# Patient Record
Sex: Male | Born: 1995
Health system: Southern US, Community
[De-identification: ages and names within clinical notes are randomized; demographics above are authoritative.]

## PROBLEM LIST (undated history)

## (undated) DIAGNOSIS — F419 Anxiety disorder, unspecified: Secondary | ICD-10-CM

## (undated) HISTORY — PX: WISDOM TOOTH EXTRACTION: SHX21

## (undated) HISTORY — DX: Anxiety disorder, unspecified: F41.9

---
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
## 2015-06-15 ENCOUNTER — Ambulatory Visit (INDEPENDENT_AMBULATORY_CARE_PROVIDER_SITE_OTHER): Payer: Federal, State, Local not specified - PPO | Admitting: Cardiovascular Disease

## 2015-06-15 ENCOUNTER — Encounter: Payer: Self-pay | Admitting: Cardiovascular Disease

## 2015-06-15 VITALS — BP 108/80 | HR 69 | Ht 67.0 in | Wt 161.8 lb

## 2015-06-15 DIAGNOSIS — G8929 Other chronic pain: Secondary | ICD-10-CM | POA: Diagnosis not present

## 2015-06-15 DIAGNOSIS — R079 Chest pain, unspecified: Secondary | ICD-10-CM

## 2015-06-15 NOTE — Assessment & Plan Note (Signed)
Etiology of his chest pain is unclear Atypical in nature. As detailed, no exacerbation with exertion, He does not have any risk factors for coronary artery disease. Tried NSAIDs with no relief, perhaps less likely pericarditis. Low risk for DVT and PE. Denies shortness of breath or leg swelling that would be associated with this. No recent immobility that would lead to DVT. Normal EKG indicating no structural heart disease, and exam is essentially normal We have discussed that if he is concerned about exercising, we could have him complete a routine treadmill study.  If symptoms get worse, could consider echocardiogram or CT scan of the chest. Long discussion with the patient and his mother concerning various testing that is available if needed. We did not change his medications. We did suggest if symptoms recur that he again try NSAIDs. Initially it was felt his symptoms were secondary to anxiety or stress but symptoms have persisted despite school being over the summer.    Total encounter time more than 60 minutes  Greater than 50% was spent in counseling and coordination of care with the patient

## 2015-06-15 NOTE — Patient Instructions (Signed)
You are doing well. No medication changes were made.  Please call if chest pain gets worse, Options include treadmill stress test, echocardiogram If severe, we could order a CT scan   Please call us if you have new issues that need to be addressed before your next appt.

## 2015-06-15 NOTE — Progress Notes (Signed)
Patient ID: Troy Butler, male    DOB: 06-May-1995, 20 y.o.   MRN: [id number]  HPI Comments: Mr. 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 Elmer Ramp for symptoms of chest pain.  Symptoms started November 2016, 1 week before finals It was relatively intense to start, chest pain on the left. No reproducible symptoms on palpation, did not hurt with deep inspiration. Seemed to stutter when it started, then went all night, went to urgent care in [facility] who referred him to the emergency room.  In the emergency room. A chest x-ray, other workup and was told that he had atypical chest pain and was discharged home. Since then he has had continuous chest pain sometimes radiating to bilateral chest, sometimes down his right arm. No change with position, no shortness of breath, no change in intensity on exertion.  Mother will sometimes see him holding his left chest.  He did start low-dose Zoloft 25 mg with increase up to 50 mg. At that time symptoms did get moderately better.  He describes the pain as 4/10, constant. Pain did not seem to improve going from 50 mg Zoloft up to 100 mg  Even on today's visit, reports his chest pain has been ongoing. Small fluctuations in intensity but he has learned to live with it. Does not feel it is better in the morning or the evening when the day, just constant. He is more nervous about activity. Likes to play basketball, soccer but has not been doing so. He does walk long distances to get to class and has not noticed any exaggeration in his symptoms.  Reports he tried NSAIDs with no relief, tried GERD medication with no relief. Does not seem to be exacerbated or relieved by food.  EKG on today's visit shows normal sinus rhythm with rate 69 bpm, no significant ST or T-wave changes       No Known Allergies    Medication List       This list is accurate as of: 06/15/15 11:15 AM.  Always  use your most recent med list.               sertraline 100 MG tablet  Commonly known as:  ZOLOFT  Take by mouth.        Past Medical History  Diagnosis Date  . Anxiety     History reviewed. No pertinent past surgical history.  Social History  reports that he has never smoked. He does not have any smokeless tobacco history on file. He reports that he does not drink alcohol or use illicit drugs.  Family History No family history of coronary artery disease  Review of Systems  Constitutional: Negative.   Respiratory: Positive for chest tightness.   Cardiovascular: Positive for chest pain.  Gastrointestinal: Negative.   Musculoskeletal: Negative.   Neurological: Negative.   Hematological: Negative.   Psychiatric/Behavioral: Negative.   All other systems reviewed and are negative.   BP 108/80 mmHg  Pulse 69  Ht  (1.702 m)  Wt 161 lb 12 oz (73.369 kg)  BMI 25.33 kg/m2  Physical Exam  Constitutional: He is oriented to person, place, and time. He appears well-developed and well-nourished.  HENT:  Head: Normocephalic.  Nose: Nose normal.  Mouth/Throat: Oropharynx is clear and moist.  Eyes: Conjunctivae are normal. Pupils are equal, round, and reactive to light.  Neck: Normal range of motion. Neck supple. No JVD present.  Cardiovascular: Normal  rate, regular rhythm, normal heart sounds and intact distal pulses.  Exam reveals no gallop and no friction rub.   No murmur heard. Pulmonary/Chest: Effort normal and breath sounds normal. No respiratory distress. He has no wheezes. He has no rales. He exhibits no tenderness.  Abdominal: Soft. Bowel sounds are normal. He exhibits no distension. There is no tenderness.  Musculoskeletal: Normal range of motion. He exhibits no edema or tenderness.  Lymphadenopathy:    He has no cervical adenopathy.  Neurological: He is alert and oriented to person, place, and time. Coordination normal.  Skin: Skin is warm and dry. No rash  noted. No erythema.  Psychiatric: He has a normal mood and affect. His behavior is normal. Judgment and thought content normal.

## 2015-06-15 NOTE — Assessment & Plan Note (Signed)
[email or web address]

## 2015-10-16 ENCOUNTER — Other Ambulatory Visit: Payer: Self-pay | Admitting: Family Medicine

## 2015-10-16 DIAGNOSIS — R079 Chest pain, unspecified: Secondary | ICD-10-CM

## 2015-10-16 DIAGNOSIS — R0789 Other chest pain: Principal | ICD-10-CM

## 2015-10-20 ENCOUNTER — Ambulatory Visit
Admission: RE | Admit: 2015-10-20 | Discharge: 2015-10-20 | Disposition: A | Discharge status: Home / Self Care | Payer: Federal, State, Local not specified - PPO | Source: Ambulatory Visit | Attending: Family Medicine | Admitting: Family Medicine

## 2015-10-20 DIAGNOSIS — R0789 Other chest pain: Secondary | ICD-10-CM | POA: Diagnosis not present

## 2015-10-20 DIAGNOSIS — R079 Chest pain, unspecified: Secondary | ICD-10-CM

## 2015-10-20 MED ORDER — IOPAMIDOL (ISOVUE-300) INJECTION 61%
75.0000 mL | Freq: Once | INTRAVENOUS | Status: AC | PRN
  Administered 2015-10-20: 75 mL via INTRAVENOUS

## 2015-10-22 ENCOUNTER — Ambulatory Visit: Payer: Federal, State, Local not specified - PPO

## 2016-08-14 ENCOUNTER — Ambulatory Visit
Admission: EM | Admit: 2016-08-14 | Discharge: 2016-08-14 | Disposition: A | Discharge status: Home / Self Care | Payer: Federal, State, Local not specified - PPO | Attending: Family Medicine | Admitting: Family Medicine

## 2016-08-14 DIAGNOSIS — H6503 Acute serous otitis media, bilateral: Secondary | ICD-10-CM

## 2016-08-14 DIAGNOSIS — H6983 Other specified disorders of Eustachian tube, bilateral: Secondary | ICD-10-CM | POA: Diagnosis not present

## 2016-08-14 MED ORDER — AMOXICILLIN-POT CLAVULANATE 875-125 MG PO TABS: 1.0000 | ORAL_TABLET | Freq: Two times a day (BID) | ORAL | 0 refills | Status: DC

## 2016-08-14 MED ORDER — FLUTICASONE PROPIONATE 50 MCG/ACT NA SUSP: 2.0000 | Freq: Every day | NASAL | 0 refills | Status: DC

## 2016-08-14 MED ORDER — LORATADINE-PSEUDOEPHEDRINE ER 10-240 MG PO TB24: 1.0000 | ORAL_TABLET | Freq: Every day | ORAL | 0 refills | Status: DC

## 2016-08-14 NOTE — ED Provider Notes (Signed)
[facility]    CSN: [id number] Arrival date & time: 08/14/16  1031     History   Chief Complaint Chief Complaint  Patient presents with  . Sinusitis    HPI Troy Butler is a 21 y.o. male.   Patient states that he was in the [place] last week she started having a sore throat on Wednesday the throat did get better started getting congested on Friday Saturday he had a miserable flight back to [place] with sinus pressure ear pains and overall headaches when he flew he still is having nasal congestion and pressure in his ears and hematemesis place as well. Otherwise teeth no other surgeries no known drug allergies he does not smoke. No smokes around him is no pertinent medical problems no pertinent family medical history relevant to today's visit.   The history is provided by the patient. No language interpreter was used.  Sinusitis  Pain details:    Location:  Frontal and maxillary   Quality:  Aching   Severity:  Moderate   Duration:  3 days   Timing:  Constant Progression:  Worsening Chronicity:  New Relieved by:  Nothing Worsened by:  Nothing Ineffective treatments:  None tried Associated symptoms: ear pain and sore throat   Ear pain:    Location:  Bilateral   Severity:  Moderate   Onset quality:  Sudden   Duration:  3 days Sore throat:    Severity:  Mild   Progression:  Improving   Past Medical History:  Diagnosis Date  . Anxiety     Patient Active Problem List   Diagnosis Date Noted  . Chest pain at rest 06/15/2015  . Chronic pain 06/15/2015    Past Surgical History:  Procedure Laterality Date  . NO PAST SURGERIES    . WISDOM TOOTH EXTRACTION         Home Medications    Prior to Admission medications   Medication Sig Start Date End Date Taking? Authorizing Provider  amoxicillin-clavulanate (AUGMENTIN) 875-125 MG tablet Take 1 tablet by mouth 2 (two) times daily. 08/14/16   Hassan Rowan, MD  fluticasone (FLONASE) 50 MCG/ACT  nasal spray Place 2 sprays into both nostrils daily. 08/14/16   Hassan Rowan, MD  loratadine-pseudoephedrine (CLARITIN-D 24 HOUR) 10-240 MG 24 hr tablet Take 1 tablet by mouth daily. 08/14/16   Hassan Rowan, MD  sertraline (ZOLOFT) 100 MG tablet Take by mouth. 04/13/15   [provider]    Family History Family History  Problem Relation Age of Onset  . Family history unknown: Yes    Social History Social History  Substance Use Topics  . Smoking status: Never Smoker  . Smokeless tobacco: Never Used  . Alcohol use No     Allergies   Patient has no known allergies.   Review of Systems Review of Systems  HENT: Positive for ear pain, facial swelling, sinus pain, sinus pressure and sore throat.   All other systems reviewed and are negative.    Physical Exam Triage Vital Signs ED Triage Vitals  Enc Vitals Group     BP 08/14/16 1116 (!) 106/57     Pulse Rate 08/14/16 1116 72     Resp 08/14/16 1116 18     Temp 08/14/16 1116 98 F (36.7 C)     Temp Source 08/14/16 1116 Oral     SpO2 08/14/16 1116 99 %     Weight 08/14/16 1114 165 lb (74.8 kg)     Height 08/14/16  1114 5\' 7"  (1.702 m)     Head Circumference --      Peak Flow --      Pain Score 08/14/16 1114 4     Pain Loc --      Pain Edu? --      Excl. in GC? --    No data found.   Updated Vital Signs BP (!) 106/57 (BP Location: Left Arm)   Pulse 72   Temp 98 F (36.7 C) (Oral)   Resp 18   Ht 5\' 7"  (1.702 m)   Wt 165 lb (74.8 kg)   SpO2 99%   BMI 25.84 kg/m   Visual Acuity Right Eye Distance:   Left Eye Distance:   Bilateral Distance:    Right Eye Near:   Left Eye Near:    Bilateral Near:     Physical Exam  Constitutional: He is oriented to person, place, and time. He appears well-developed and well-nourished.  HENT:  Head: Normocephalic and atraumatic.  Right Ear: Hearing normal. Tympanic membrane is injected and erythematous.  Left Ear: Hearing and ear canal normal. Tympanic membrane is  injected and erythematous.  Nose: Mucosal edema present. Right sinus exhibits maxillary sinus tenderness and frontal sinus tenderness. Left sinus exhibits maxillary sinus tenderness and frontal sinus tenderness.  Mouth/Throat: Uvula is midline and mucous membranes are normal. No uvula swelling. Posterior oropharyngeal erythema present.  Eyes: Conjunctivae and EOM are normal. Pupils are equal, round, and reactive to light.  Neck: Normal range of motion.  Pulmonary/Chest: Effort normal.  Musculoskeletal: Normal range of motion.  Lymphadenopathy:    He has cervical adenopathy.  Neurological: He is alert and oriented to person, place, and time.  Skin: Skin is warm and dry.  Psychiatric: He has a normal mood and affect.  Vitals reviewed.    UC Treatments / Results  Labs (all labs ordered are listed, but only abnormal results are displayed) Labs Reviewed - No data to display  EKG  EKG Interpretation None       Radiology No results found.  Procedures Procedures (including critical care time)  Medications Ordered in UC Medications - No data to display   Initial Impression / Assessment and Plan / UC Course  I have reviewed the triage vital signs and the nursing notes.  Pertinent labs & imaging results that were available during my care of the patient were reviewed by me and considered in my medical decision making (see chart for details).    He does report inability to do a Valsalva maneuver to open his ears and once again he was able do that today we will place him on Augmentin 875 one tablet twice a day or today 1 tablet daily and Flonase days spray 2 puffs each nostril followed his PCP next week if needed work note given for him for today as well   Final Clinical Impressions(s) / UC Diagnoses   Final diagnoses:  Bilateral acute serous otitis media, recurrence not specified  Dysfunction of both eustachian tubes    New Prescriptions Discharge Medication List as of  08/14/2016 12:11 PM    START taking these medications   Details  amoxicillin-clavulanate (AUGMENTIN) 875-125 MG tablet Take 1 tablet by mouth 2 (two) times daily., Starting Sun 08/14/2016, Normal    fluticasone (FLONASE) 50 MCG/ACT nasal spray Place 2 sprays into both nostrils daily., Starting Sun 08/14/2016, Normal    loratadine-pseudoephedrine (CLARITIN-D 24 HOUR) 10-240 MG 24 hr tablet Take 1 tablet by mouth daily., Starting Sun  08/14/2016, Normal         Hassan RowanWade, Cylinda Santoli, MD 08/14/16 [phone]

## 2016-08-14 NOTE — ED Triage Notes (Signed)
Patient complains of sinus pain and pressure, pressure in ears. Patient states that he flew back yesterday from [place] yesterday. Patient states that pain yesterday in his ears. Patient states that symptoms originally started 1 week ago.

## 2016-11-16 ENCOUNTER — Other Ambulatory Visit: Payer: Self-pay | Admitting: Family Medicine

## 2016-11-16 ENCOUNTER — Ambulatory Visit (INDEPENDENT_AMBULATORY_CARE_PROVIDER_SITE_OTHER): Payer: Federal, State, Local not specified - PPO | Admitting: Family Medicine

## 2016-11-16 ENCOUNTER — Encounter: Payer: Self-pay | Admitting: Family Medicine

## 2016-11-16 VITALS — BP 128/68 | HR 74 | Temp 98.3°F | Resp 16 | Ht 67.0 in | Wt 167.0 lb

## 2016-11-16 DIAGNOSIS — G8929 Other chronic pain: Secondary | ICD-10-CM

## 2016-11-16 DIAGNOSIS — Z0000 Encounter for general adult medical examination without abnormal findings: Secondary | ICD-10-CM

## 2016-11-16 DIAGNOSIS — Z7689 Persons encountering health services in other specified circumstances: Secondary | ICD-10-CM

## 2016-11-16 DIAGNOSIS — R0789 Other chest pain: Secondary | ICD-10-CM

## 2016-11-16 DIAGNOSIS — Z13220 Encounter for screening for lipoid disorders: Secondary | ICD-10-CM

## 2016-11-16 NOTE — Progress Notes (Signed)
Subjective:    Patient ID: Troy Butler, male    DOB: 03-05-1995, 21 y.o.   MRN: [id number]  Troy Butler is a 21 y.o. male presenting on 11/16/2016 for Establish Care (pt is concerned about chest pain onset 2 years)  Previously established with Dr Elmer Ramp ([facility] in [place]), patient here now to re-establish since previous PCP left that practice.  HPI   CHEST PAIN, Atypical Reports one concern with chest pain over past 2 years, seems to be more constant and present most days, nothing seems to make worse or improves, describes various areas of chest that he can localize to 1-2 fingers, seems to "move around" can have different pains, either tightness or pinching but not a pressure or aching pain. Currently he feels sensation at this time and it is active mild pain, has less of a sensation if he is not actively thinking about it or if distracted then may not feel any symptom at all. - He reviews prior evaluation from PCP and even including referral to [facility] Dr Mariah Milling, he has had various meds including Tylenol, NSAID courses prolonged without relief, trial on Sertraline 50-100mg  for 3 months for presumed anxiety/panic symptoms (which he does not think were the case, and has not had any anxiety problems by report), also tried PPI for GERD for several weeks to months without relief. Cardiology evaluated with EKG, CXR, and eventually Chest CT unremarkable. - Currently not on any medicine for it, tolerating it well overall and states that it does not limit his daily function or activity. 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 - He is actively working at [employer], does  some physical work lifting boxes shipping/receiving, active without any issue or trigger of the chest symptoms  Health Maintenance: - Due for Flu Shot, declines today despite counseling on benefits  Depression screen PHQ 2/9 11/16/2016  Decreased Interest 0  Down, Depressed, Hopeless 0  PHQ - 2 Score 0   GAD 7 : Generalized Anxiety Score 11/16/2016  Nervous, Anxious, on Edge 0  Control/stop worrying 0  Worry too much - different things 0  Trouble relaxing 0  Restless 0  Easily annoyed or irritable 0  Afraid - awful might happen 0  Total GAD 7 Score 0  Anxiety Difficulty Not difficult at all    History reviewed. No pertinent past medical history. Past Surgical History:  Procedure Laterality Date  . WISDOM TOOTH EXTRACTION     Social History   Social History  . Marital status: Single    Spouse name: N/A  . Number of children: N/A  . Years of education: College   Occupational History  . College Student     [employer] (Graduate within 1 year)  . [employer] (Supply)    Social History Main Topics  . Smoking status: Never Smoker  . Smokeless tobacco: Never Used  . Alcohol use 0.6 oz/week    1 Cans of beer per week     Comment: Rare alcohol consumption  . Drug use: No  . Sexual activity: Not on file   Other Topics Concern  . Not on file   Social History Narrative  .  No narrative on file   Family History  Problem Relation Age of Onset  . Prostate cancer Neg Hx   . Colon cancer Neg Hx   . Heart disease Neg Hx    No current outpatient prescriptions on file prior to visit.   No current facility-administered medications on file prior to visit.     Review of Systems  Constitutional: Negative for activity change, appetite change, chills, diaphoresis, fatigue, fever and unexpected weight change.  HENT: Negative for congestion, hearing loss and sinus pressure.   Eyes: Negative for visual disturbance.  Respiratory: Negative for apnea,  cough, choking, chest tightness, shortness of breath and wheezing.   Cardiovascular: Positive for chest pain (chest wall pain vs discomfort, chronic see note). Negative for palpitations and leg swelling.  Gastrointestinal: Negative for abdominal pain, anal bleeding, blood in stool, constipation, diarrhea, nausea and vomiting.  Endocrine: Negative for cold intolerance and polyuria.  Genitourinary: Negative for decreased urine volume, difficulty urinating, dysuria, frequency, hematuria and testicular pain.  Musculoskeletal: Negative for arthralgias, back pain and neck pain.  Skin: Negative for rash.  Allergic/Immunologic: Negative for environmental allergies.  Neurological: Negative for dizziness, weakness, light-headedness, numbness and headaches.  Hematological: Negative for adenopathy.  Psychiatric/Behavioral: Negative for behavioral problems, dysphoric mood, self-injury, sleep disturbance and suicidal ideas. The patient is not nervous/anxious.    Per HPI unless specifically indicated above     Objective:    BP 128/68   Pulse 74   Temp 98.3 F (36.8 C) (Oral)   Resp 16   Ht  (1.702 m)   Wt 167 lb (75.8 kg)   BMI 26.16 kg/m   Wt Readings from Last 3 Encounters:  11/16/16 167 lb (75.8 kg)  08/14/16 165 lb (74.8 kg)  06/15/15 161 lb 12 oz (73.4 kg) (61 %, Z= 0.27)*   * Growth percentiles are based on CDC 2-20 Years data.    Physical Exam  Constitutional: He is oriented to person, place, and time. He appears well-developed and well-nourished. No distress.  Well-appearing, comfortable, cooperative, very pleasant, athletic build  HENT:  Head: Normocephalic and atraumatic.  Mouth/Throat: Oropharynx is clear and moist.  Frontal / maxillary sinuses non-tender. Nares patent without purulence or edema. Bilateral TMs clear without erythema, effusion or bulging. Oropharynx clear without erythema, exudates, edema or asymmetry.  Eyes: Pupils are equal, round, and reactive to light.  Conjunctivae and EOM are normal. Right eye exhibits no discharge. Left eye exhibits no discharge.  Neck: Normal range of motion. Neck supple. No thyromegaly present.  Cardiovascular: Normal rate, regular rhythm, normal heart sounds and intact distal pulses.   No murmur heard. Pulmonary/Chest: Effort normal and breath sounds normal. No respiratory distress. He has no wheezes. He has no rales. He exhibits no tenderness (Non reproducible, but patient able to localize pain to 1-2 fingers in right upper to mid chest more midline not lateral).  Abdominal: Soft. Bowel sounds are normal. He exhibits no distension and no mass. There is no tenderness.  Musculoskeletal: Normal range of motion. He exhibits no edema or tenderness.  Upper / Lower Extremities: - Normal muscle tone, strength bilateral upper extremities 5/5, lower extremities 5/5  Lymphadenopathy:    He has no cervical adenopathy.  Neurological: He is alert and oriented to person, place, and time.  Distal sensation intact to light touch all extremities  Skin: Skin is warm and dry. No rash noted. He is not diaphoretic. No erythema.  Psychiatric: He has a normal mood and affect. His behavior  is normal.  Well groomed, good eye contact, normal speech and thoughts. Good insight into health.  Nursing note and vitals reviewed.  No results found for this or any previous visit.    Assessment & Plan:   Problem List Items Addressed This Visit    Chest wall pain, chronic - Primary    Stable chronic problem >2 yr with active constant chest wall discomfort localized now to R mid to upper chest wall, minimal to mild symptom. No associated symptoms or factors. Not reproducible. Not exertional. - Prior negative work-up includes Cardiac (Cardiology, EKG, CXR) and Chest CT, also trial on med management for GERD, Anxiety, MSK with NSAIDs/Tylenol without relief  Plan: 1. Discussion today on potential differential diagnosis, seems to be limited options at this  point given extensive previous work-up. Symptoms is bothering him but not limiting his function in anyway, overall given reassurance today. Reviewed possibility of more superficial cause such as nerve or fascial symptoms and maybe referred pain, additional consideration not fully reviewed would be myofascial or even rib related with possible slipping rib syndrome (but does not endorse popping or clicking). - Recommend consider alternative approach now - such as chiropractor, accupuncture - Future may consider OMT adjustment if interested, would rib raising or other myofascial techniques 2. No new meds - but may try OTC topical therapy such as muscle rub, tiger balm, asper-creme since he is able to localize symptoms 3. Future labs ordered for 6 months then Annual Physical and if no significant change or concern can do yearly visits for physical       Other Visit Diagnoses    Encounter to establish care with new doctor          No orders of the defined types were placed in this encounter.   Follow up plan: Return in about 6 months (around 05/17/2017) for Annual Physical.  Saralyn Pilar, DO [facility] 11/16/2016, 10:10 PM

## 2016-11-16 NOTE — Assessment & Plan Note (Addendum)
Stable chronic problem >2 yr with active constant chest wall discomfort localized now to R mid to upper chest wall, minimal to mild symptom. No associated symptoms or factors. Not reproducible. Not exertional. - Prior negative work-up includes Cardiac (Cardiology, EKG, CXR) and Chest CT, also trial on med management for GERD, Anxiety, MSK with NSAIDs/Tylenol without relief  Plan: 1. Discussion today on potential differential diagnosis, seems to be limited options at this point given extensive previous work-up. Symptoms is bothering him but not limiting his function in anyway, overall given reassurance today. Reviewed possibility of more superficial cause such as nerve or fascial symptoms and maybe referred pain, additional consideration not fully reviewed would be myofascial or even rib related with possible slipping rib syndrome (but does not endorse popping or clicking). - Recommend consider alternative approach now - such as chiropractor, accupuncture - Future may consider OMT adjustment if interested, would rib raising or other myofascial techniques 2. No new meds - but may try OTC topical therapy such as muscle rub, tiger balm, asper-creme since he is able to localize symptoms 3. Future labs ordered for 6 months then Annual Physical and if no significant change or concern can do yearly visits for physical

## 2016-11-16 NOTE — Patient Instructions (Addendum)
Thank you for coming to the clinic today.  1.  I don't know the exact cause of your chest wall discomfort. It sounds like you have completed a very thorough work-up in the past with previous doctors including Cardiologist  I am very reassured by every bit of history that I hear today and I think that this is more of a benign nuisance rather than a serious medical problem, however it is hard to say.  One option to consider would be more skin / nerve / fascia symptoms - this could be related to these soft tissues instead of the muscle, bone or joints. And we have ruled out heart as a cause.  CHIROPRACTOR  [facility] [address] [place] [place], [place] [address] Phone: [phone]  [facility] [address] [place], [place] [address]  Dr Annamaria Boots [facility] (sends detailed noted)  ------------------------------------------  Patrici Ranks Chiropractor in [place]  Consider acupuncture as an alternative trial in future   DUE for FASTING BLOOD WORK (no food or drink after midnight before the lab appointment, only water or coffee without cream/sugar on the morning of)  SCHEDULE "Lab Only" visit in the morning at the clinic for lab draw in 6 MONTHS   - Make sure Lab Only appointment is at about 1 week before your next appointment, so that results will be available  For Lab Results, once available within 2-3 days of blood draw, you can can log in to MyChart online to view your results and a brief explanation. Also, we can discuss results at next follow-up visit.  Please schedule a Follow-up Appointment to: Return in about 6 months (around 05/17/2017) for Annual Physical.  If you have any other questions or concerns, please feel free to call the clinic or send a message through MyChart. You may also schedule an earlier appointment if necessary.  Additionally, you may be receiving a survey about your experience at our clinic within a few days to 1 week by e-mail or mail. We  value your feedback.  Saralyn Pilar, DO [facility], [place]

## 2016-11-21 ENCOUNTER — Encounter: Payer: Self-pay | Admitting: Family Medicine

## 2017-05-11 ENCOUNTER — Other Ambulatory Visit: Payer: Federal, State, Local not specified - PPO

## 2017-05-18 ENCOUNTER — Encounter: Payer: Self-pay | Admitting: Family Medicine

## 2017-05-18 ENCOUNTER — Ambulatory Visit (INDEPENDENT_AMBULATORY_CARE_PROVIDER_SITE_OTHER): Payer: Federal, State, Local not specified - PPO | Admitting: Family Medicine

## 2017-05-18 ENCOUNTER — Other Ambulatory Visit: Payer: Self-pay

## 2017-05-18 VITALS — BP 116/68 | HR 78 | Temp 98.2°F | Resp 16 | Ht 67.0 in | Wt 158.0 lb

## 2017-05-18 DIAGNOSIS — Z0000 Encounter for general adult medical examination without abnormal findings: Secondary | ICD-10-CM

## 2017-05-18 DIAGNOSIS — G8929 Other chronic pain: Secondary | ICD-10-CM | POA: Diagnosis not present

## 2017-05-18 DIAGNOSIS — R0789 Other chest pain: Secondary | ICD-10-CM | POA: Diagnosis not present

## 2017-05-18 DIAGNOSIS — Z13220 Encounter for screening for lipoid disorders: Secondary | ICD-10-CM

## 2017-05-18 NOTE — Progress Notes (Signed)
Subjective:    Patient ID: Troy CortiDylan K Centrella, male    DOB: [birth year], 22 y.o.   MRN: [id number]  Troy Butler is a 22 y.o. male presenting on 05/18/2017 for Annual Exam   HPI   Lifestyle: - He is doing well overall. Currently still attending school at [employer] for degree in Supply Chain Management will graduate in Dec 2019. 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 Does not do heavy lifting - He is currently not active and not trying to lose weight. - He goes to gym on occasion, but recently has not been as active with this, busy with school and work. He used to do weight training and cardio treadmill exercise - He has tried to improve healthy diet, better choices and now has some appropriate weight loss, down 7-10 lbs. He is at healthy BMI 24 - See social history below, again reviewed - he rarely consumes alcohol, never smoker - He is sexually active, uses condoms, denies any symptoms of STD and declines STD screening  FOLLOW-UP Atypical Chest Pain - Last visit with me 11/16/16, for initial visit for same problem, chronic >2 years, treated with reassurance and offered possibility for trial OMT, Chiropractor among other options, reviewed possible diagnoses such as Precordial Catch Syndrome or Slipping Rib Syndrome, seems to be benign MSK etiology, see prior notes for background information. - Today patient reports his symptoms have not changed much since last visit. He still experiences the discomfort, it is not worse but does not seem to be much better. It does not bother him or interfere with his daily function - Denies any other associated symptoms or new concerns   Health Maintenance:  He has declined routine HIV screening test  UTD TDap vaccine  [facility] 05/18/2017 11/16/2016  Decreased Interest 0 0  Down, Depressed, Hopeless 0 0  PHQ - 2 Score 0 0    History reviewed. No pertinent past medical history. Past Surgical History:  Procedure Laterality Date  . WISDOM  TOOTH EXTRACTION     Social History   Socioeconomic History  . Marital status: Single    Spouse name: Not on file  . Number of children: Not on file  . Years of education: College  . Highest education level: Not on file  Occupational History  . Occupation: [employer]    Comment: [employer] (Graduate within 1 year)  . 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 Financial resource strain: Not on file  . Food insecurity:    Worry: Not on file    Inability: Not on file  . Transportation needs:    Medical: Not on file    Non-medical: Not on file  Tobacco Use  . Smoking status: Never Smoker  . Smokeless tobacco: Never Used  Substance and Sexual Activity  . Alcohol use: Yes    Alcohol/week: 0.6 oz    Types: 1 Cans of beer per week    Comment: Rare alcohol consumption  . Drug use: No  . Sexual activity: Not on file  Lifestyle  . Physical activity:    Days per week: Not on file    Minutes per session: Not on file  . Stress: Not on file  Relationships  . Social connections:    Talks on phone: Not on file    Gets together: Not on file    Attends religious service: Not on file    Active member of club or organization:  Not on file    Attends meetings of clubs or organizations: Not on file    Relationship status: Not on file  . Intimate partner violence:    Fear of current or ex partner: Not on file    Emotionally abused: Not on file    Physically abused: Not on file    Forced sexual activity: Not on file  Other Topics Concern  . Not on file  Social History Narrative  . Not on file   Family History  Problem Relation Age of Onset  . Prostate cancer Neg Hx   . Colon cancer Neg Hx   . Heart disease Neg Hx    No current outpatient medications on file prior to visit.   No current facility-administered medications on file prior to visit.     Review of Systems  Constitutional: Negative for activity change, appetite change, chills,  diaphoresis, fatigue and fever.  HENT: Negative for congestion, hearing loss and sinus pressure.   Eyes: Negative for visual disturbance.  Respiratory: Negative for apnea, cough, choking, chest tightness, shortness of breath and wheezing.   Cardiovascular: Negative for chest pain, palpitations and leg swelling.  Gastrointestinal: Negative for abdominal pain, anal bleeding, blood in stool, constipation, diarrhea, nausea and vomiting.  Endocrine: Negative for cold intolerance and polyuria.  Genitourinary: Negative for decreased urine volume, difficulty urinating, dysuria, frequency, hematuria, scrotal swelling, testicular pain and urgency.  Musculoskeletal: Negative for arthralgias, back pain, gait problem and neck pain.  Skin: Negative for rash.  Allergic/Immunologic: Negative for environmental allergies.  Neurological: Negative for dizziness, weakness, light-headedness, numbness and headaches.  Hematological: Negative for adenopathy.  Psychiatric/Behavioral: Negative for behavioral problems, dysphoric mood and sleep disturbance. The patient is not nervous/anxious.    Per HPI unless specifically indicated above      Objective:    BP 116/68 (BP Location: Left Arm, Patient Position: Sitting, Cuff Size: Normal)   Pulse 78   Temp 98.2 F (36.8 C)   Resp 16   Ht 5\' 7"  (1.702 m)   Wt 158 lb (71.7 kg)   SpO2 100%   BMI 24.75 kg/m   Wt Readings from Last 3 Encounters:  05/18/17 158 lb (71.7 kg)  11/16/16 167 lb (75.8 kg)  08/14/16 165 lb (74.8 kg)    Physical Exam  Constitutional: He is oriented to person, place, and time. He appears well-developed and well-nourished. No distress.  Well-appearing, comfortable, cooperative  HENT:  Head: Normocephalic and atraumatic.  Mouth/Throat: Oropharynx is clear and moist.  Frontal / maxillary sinuses non-tender. Nares patent without purulence or edema. Bilateral TMs clear without erythema, effusion or bulging. Oropharynx clear without erythema,  exudates, edema or asymmetry.  Eyes: Pupils are equal, round, and reactive to light. Conjunctivae and EOM are normal. Right eye exhibits no discharge. Left eye exhibits no discharge.  Neck: Normal range of motion. Neck supple. No thyromegaly present.  Cardiovascular: Normal rate, regular rhythm, normal heart sounds and intact distal pulses.  No murmur heard. Pulmonary/Chest: Effort normal and breath sounds normal. No respiratory distress. He has no wheezes. He has no rales. He exhibits no tenderness (Non reproducible).  Abdominal: Soft. Bowel sounds are normal. He exhibits no distension and no mass. There is no tenderness.  Genitourinary:  Genitourinary Comments: Declined external genital / hernia exam.  Musculoskeletal: Normal range of motion. He exhibits no edema or tenderness.  Upper / Lower Extremities: - Normal muscle tone, strength bilateral upper extremities 5/5, lower extremities 5/5  Lymphadenopathy:    He has  no cervical adenopathy.  Neurological: He is alert and oriented to person, place, and time.  Distal sensation intact to light touch all extremities  Skin: Skin is warm and dry. No rash noted. He is not diaphoretic. No erythema.  Psychiatric: He has a normal mood and affect. His behavior is normal.  Well groomed, good eye contact, normal speech and thoughts  Nursing note and vitals reviewed.  No results found for this or any previous visit.    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 Not reproducible. Not exertional. - Prior negative work-up includes Cardiac (Cardiology, EKG, CXR) and Chest CT, also trial on med management for GERD, Anxiety, MSK with NSAIDs/Tylenol without relief  Plan: 1. Reassurance again - no  new concerns, reviewed possible benign MSK dx - Remain off meds - Recommend to keep monitoring for change - Encourage to consider trial with chiropractor in future if needed - Pending lab results - Follow-up as needed if any change or worsening       Other Visit Diagnoses    Annual physical exam    -  Primary Updated health maintenance and history in chart Declines routine HIV screen Obtained fasting labs today for baseline, will review and forward results Encourage maintain healthy lifestyle, resume regular exercise as needed    Screening cholesterol level          No orders of the defined types were placed in this encounter.   Follow up plan: Return in about 1 year (around 05/19/2018) for Annual Physical.  Saralyn Pilar, DO [facility] 05/18/2017, 9:27 AM

## 2017-05-18 NOTE — Patient Instructions (Addendum)
Thank you for coming to the office today.  Keep up the good work.  Good luck with graduation and future job.  If chest pain symptoms change let me know or come back sooner.  Will release blood test results to your MyChart as soon as available.  Please schedule a Follow-up Appointment to: Return in about 1 year (around 05/19/2018) for Annual Physical.  If you have any other questions or concerns, please feel free to call the office or send a message through MyChart. You may also schedule an earlier appointment if necessary.  Additionally, you may be receiving a survey about your experience at our office within a few days to 1 week by e-mail or mail. We value your feedback.  Saralyn PilarAlexander Ciarah Peace, DO [facility], [place]

## 2017-05-18 NOTE — Assessment & Plan Note (Signed)
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 Not reproducible. Not exertional. - Prior negative work-up includes Cardiac (Cardiology, EKG, CXR) and Chest CT, also trial on med management for GERD, Anxiety, MSK with NSAIDs/Tylenol without relief  Plan: 1. Reassurance again - no new concerns, reviewed possible benign MSK dx - Remain off meds - Recommend to keep monitoring for change - Encourage to consider trial with chiropractor in future if needed - Pending lab results - Follow-up as needed if any change or worsening

## 2017-05-19 LAB — CBC WITH DIFFERENTIAL/PLATELET
BASOS ABS: 73 {cells}/uL (ref 0–200)
Basophils Relative: 1.4 %
EOS ABS: 151 {cells}/uL (ref 15–500)
Eosinophils Relative: 2.9 %
HCT: 48 % (ref 38.5–50.0)
Hemoglobin: 16.4 g/dL (ref 13.2–17.1)
Lymphs Abs: 1882 cells/uL (ref 850–3900)
MCH: 29.5 pg (ref 27.0–33.0)
MCHC: 34.2 g/dL (ref 32.0–36.0)
MCV: 86.3 fL (ref 80.0–100.0)
MONOS PCT: 10.6 %
MPV: 10.6 fL (ref 7.5–12.5)
Neutro Abs: 2543 cells/uL (ref 1500–7800)
Neutrophils Relative %: 48.9 %
PLATELETS: 232 10*3/uL (ref 140–400)
RBC: 5.56 10*6/uL (ref 4.20–5.80)
RDW: 12.8 % (ref 11.0–15.0)
TOTAL LYMPHOCYTE: 36.2 %
WBC mixed population: 551 cells/uL (ref 200–950)
WBC: 5.2 10*3/uL (ref 3.8–10.8)

## 2017-05-19 LAB — COMPLETE METABOLIC PANEL WITH GFR
AG RATIO: 2 (calc) (ref 1.0–2.5)
ALKALINE PHOSPHATASE (APISO): 66 U/L (ref 40–115)
ALT: 17 U/L (ref 9–46)
AST: 19 U/L (ref 10–40)
Albumin: 4.7 g/dL (ref 3.6–5.1)
BILIRUBIN TOTAL: 0.8 mg/dL (ref 0.2–1.2)
BUN: 16 mg/dL (ref 7–25)
CHLORIDE: 104 mmol/L (ref 98–110)
CO2: 30 mmol/L (ref 20–32)
Calcium: 9.6 mg/dL (ref 8.6–10.3)
Creat: 1.06 mg/dL (ref 0.60–1.35)
GFR, EST AFRICAN AMERICAN: 116 mL/min/{1.73_m2} (ref >=60)
GFR, Est Non African American: 100 mL/min/{1.73_m2} (ref >=60)
Globulin: 2.3 g/dL (calc) (ref 1.9–3.7)
Glucose, Bld: 90 mg/dL (ref 65–99)
POTASSIUM: 4.5 mmol/L (ref 3.5–5.3)
Sodium: 140 mmol/L (ref 135–146)
Total Protein: 7 g/dL (ref 6.1–8.1)

## 2017-05-19 LAB — LIPID PANEL
CHOL/HDL RATIO: 3.6 (calc) (ref <5.0)
Cholesterol: 160 mg/dL (ref <200)
HDL: 45 mg/dL (ref >40)
LDL Cholesterol (Calc): 102 mg/dL (calc) — ABNORMAL HIGH
Non-HDL Cholesterol (Calc): 115 mg/dL (calc) (ref <130)
TRIGLYCERIDES: 43 mg/dL (ref <150)

## 2017-07-19 IMAGING — CT CT CHEST W/ CM
2 of 3 series · 15 of 36 positions shown, 18 images · IV contrast (iopamidol)
Comparison: None.

CLINICAL DATA: Chest pain over the last year. No history of trauma
or shortness of breath. Unknown etiology.

EXAM:
CT CHEST WITH CONTRAST
TECHNIQUE: Multidetector CT imaging of the chest was performed during
intravenous contrast administration.
CONTRAST:  75mL ELHYPR-TAA IOPAMIDOL (ELHYPR-TAA) INJECTION 61%

[Series 2: axial st · axial · 0.66mm/px · z∈[-650,-396]mm · 12 of 149 slices shown, 15 images]
[im 11/149  mediastinal]
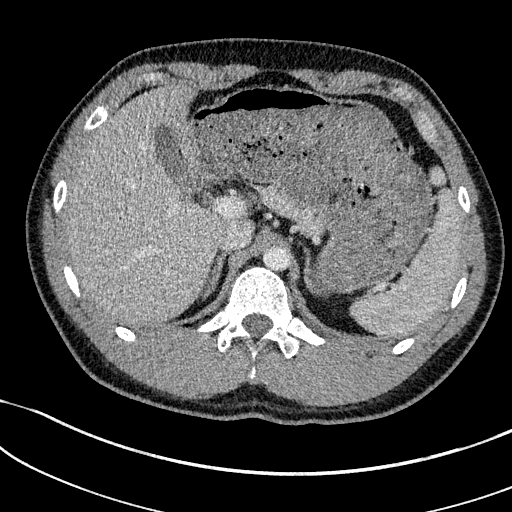
[im 11/149  lung]
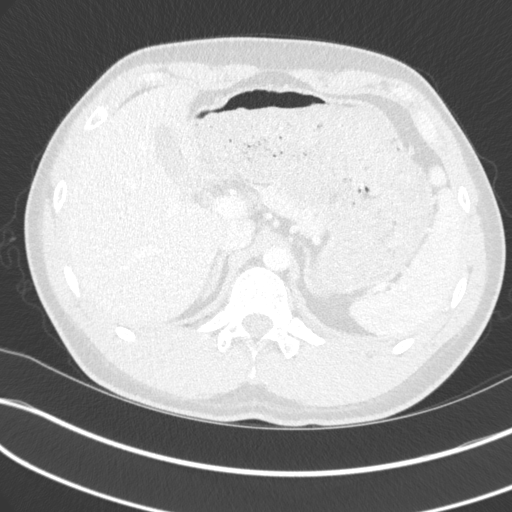
[im 22/149  lung]
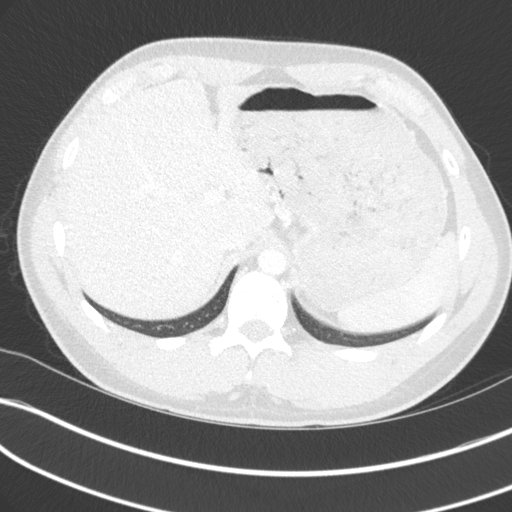
[im 33/149  lung]
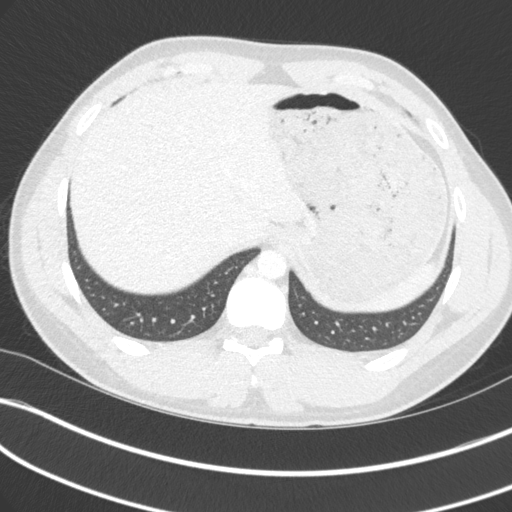
[im 44/149  lung]
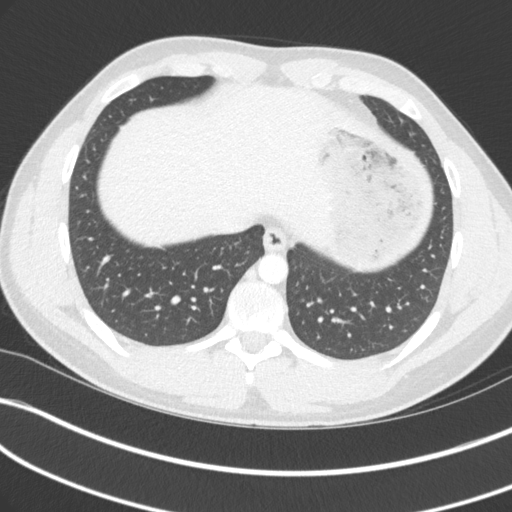
[im 55/149  mediastinal]
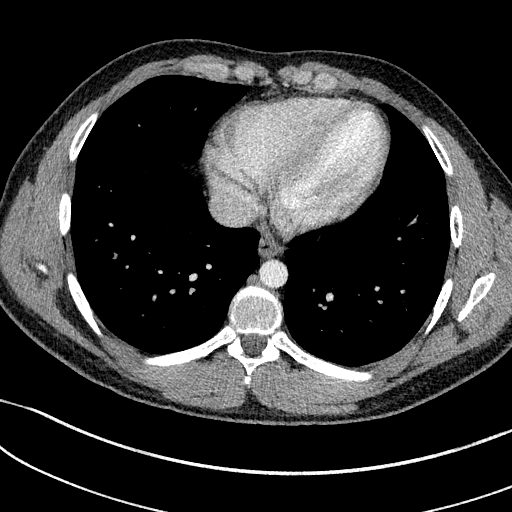
[im 55/149  lung]
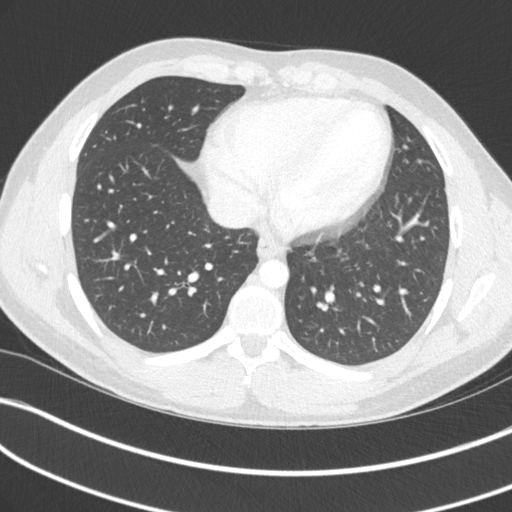
[im 66/149  lung]
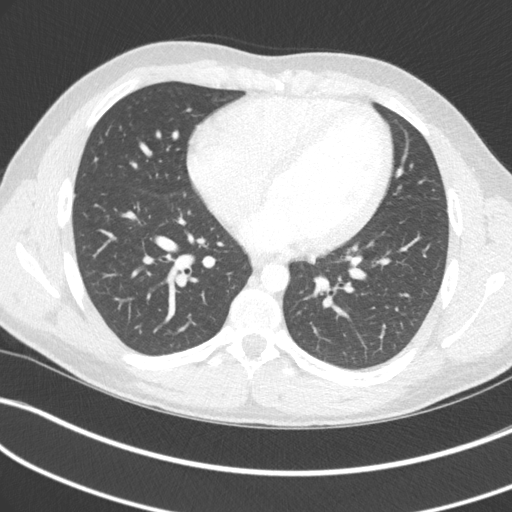
[im 83/149  lung]
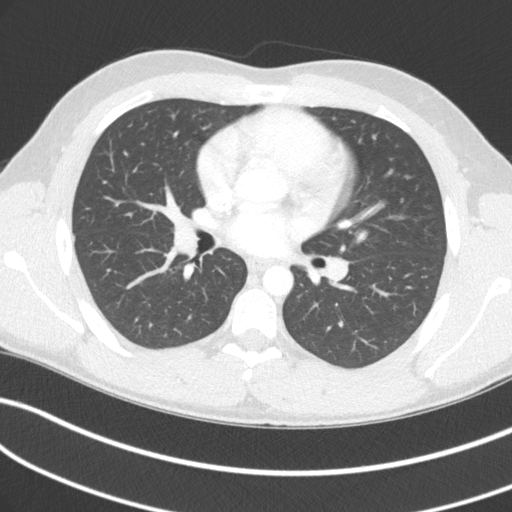
[im 94/149  lung]
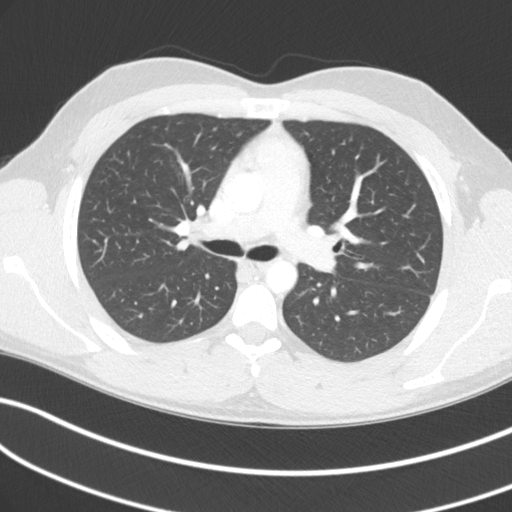
[im 105/149  mediastinal]
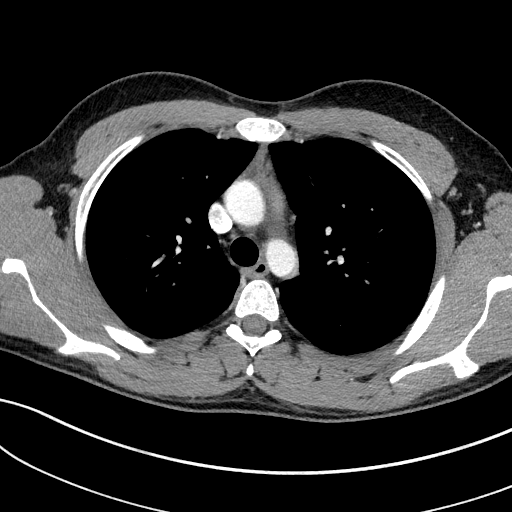
[im 105/149  lung]
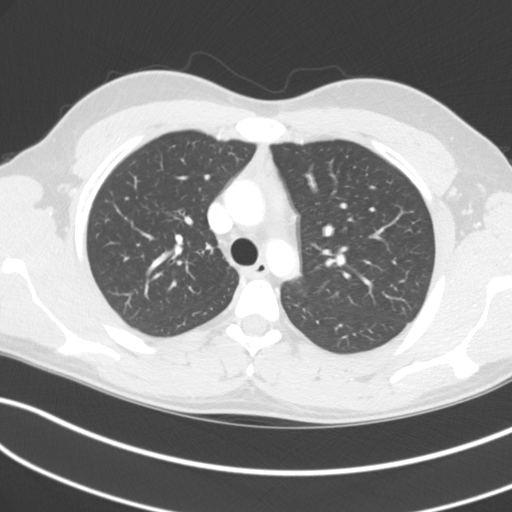
[im 116/149  lung]
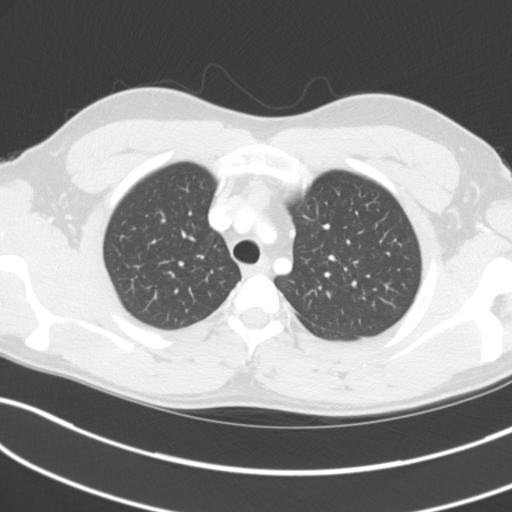
[im 127/149  lung]
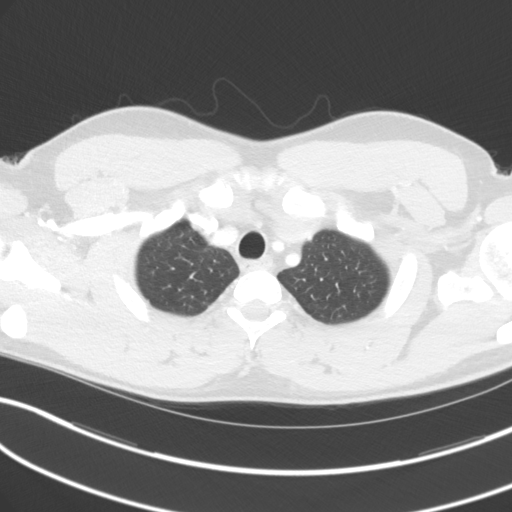
[im 138/149  lung]
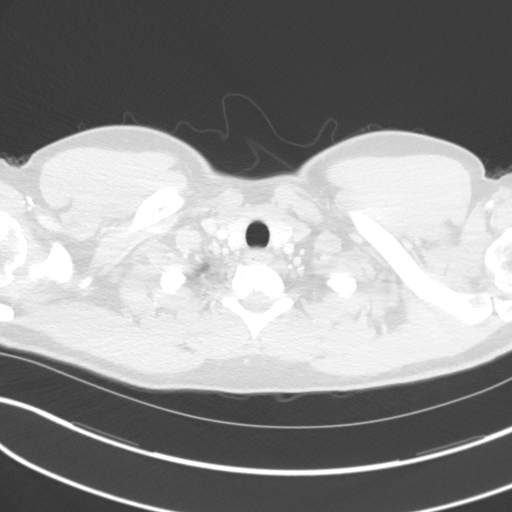

[Series 5: coronal · coronal · 0.60mm/px · 3 of 115 slices shown]
[im 23/115  lung]
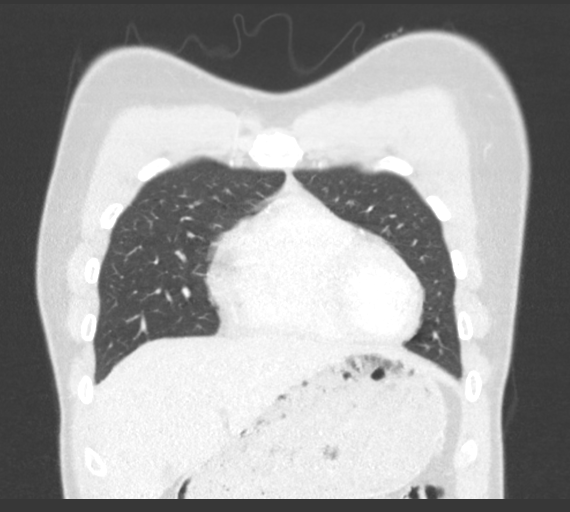
[im 46/115  lung]
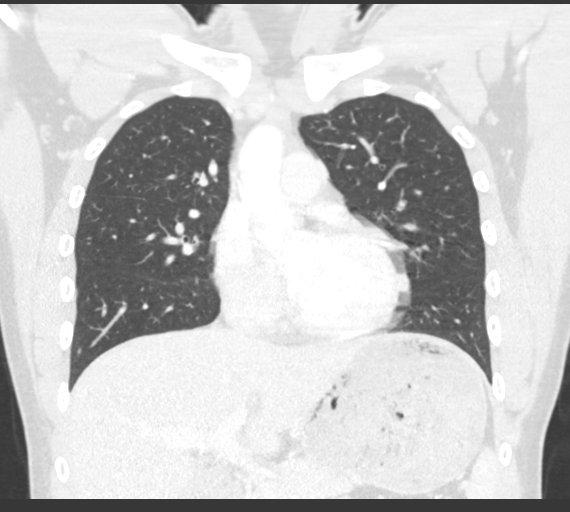
[im 69/115  lung]
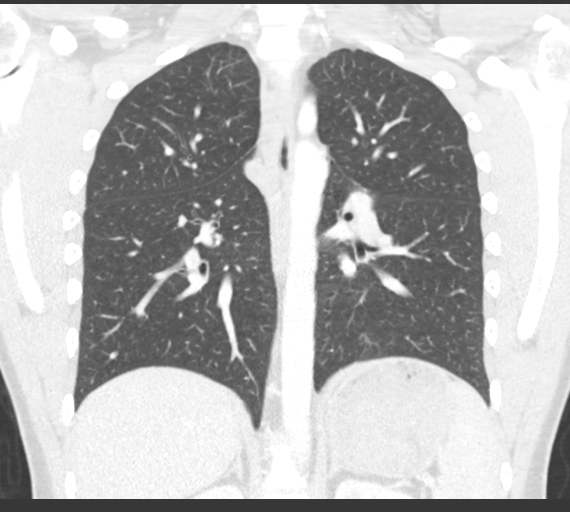

[15 of 36 positions shown; findings below may reference images not displayed]

FINDINGS: Cardiovascular: Heart shows normal size and normal relative chamber
is. No visible coronary artery calcification. The aorta is normally
formed and is not show any atherosclerosis or other lesion.
Pulmonary arteries appear normal. No pericardial fluid.

Mediastinum/Nodes: No mass or lymphadenopathy. Normal thymus for
age.

Lungs/Pleura: No pleural fluid. Lung parenchyma is clear and normal.

Upper Abdomen: Normal

Musculoskeletal: Normal. Minimal endplate Schmorl's nodes in the
thoracic region, frequently seen in asymptomatic individuals.

No axillary pathology.  No chest wall muscular pathology.
IMPRESSION: Normal examination.  No etiology of pain identified.

## 2017-08-22 ENCOUNTER — Encounter: Payer: Self-pay | Admitting: Family Medicine

## 2019-05-17 ENCOUNTER — Ambulatory Visit: Payer: Federal, State, Local not specified - PPO | Attending: Internal Medicine

## 2019-05-17 DIAGNOSIS — Z23 Encounter for immunization: Secondary | ICD-10-CM

## 2019-05-17 NOTE — Progress Notes (Signed)
   Covid-19 Vaccination Clinic  Name:  Troy Butler    MRN: [id number] DOB: 13-Aug-1995  05/17/2019  [name] was observed post Covid-19 immunization for 15 minutes without incident. He was provided with Vaccine Information Sheet and instruction to access the V-Safe system.   [name] was instructed to call 911 with any severe reactions post vaccine: Marland Kitchen Difficulty breathing  . Swelling of face and throat  . A fast heartbeat  . A bad rash all over body  . Dizziness and weakness   Immunizations Administered    Name Date Dose VIS Date Route   Pfizer COVID-19 Vaccine 05/17/2019  4:10 PM 0.3 mL 01/18/2019 Intramuscular   Manufacturer: [employer], [employer]   Lot: [id number]   NDC: [address]-9

## 2019-06-12 ENCOUNTER — Ambulatory Visit: Payer: Federal, State, Local not specified - PPO | Attending: Internal Medicine

## 2019-06-12 DIAGNOSIS — Z23 Encounter for immunization: Secondary | ICD-10-CM

## 2019-06-12 NOTE — Progress Notes (Signed)
   Covid-19 Vaccination Clinic  Name:  Troy Butler    MRN: [id number] DOB: 11-12-1995  06/12/2019  Mr. Troy Butler was observed post Covid-19 immunization for 15 minutes without incident. He was provided with Vaccine Information Sheet and instruction to access the V-Safe system.   [name] was instructed to call 911 with any severe reactions post vaccine: Marland Kitchen Difficulty breathing  . Swelling of face and throat  . A fast heartbeat  . A bad rash all over body  . Dizziness and weakness   Immunizations Administered    Name Date Dose VIS Date Route   Pfizer COVID-19 Vaccine 06/12/2019  9:45 AM 0.3 mL 04/03/2018 Intramuscular   Manufacturer: [employer], [employer]   Lot: [id number]   NDC: [address]-1

## 2019-09-17 ENCOUNTER — Other Ambulatory Visit: Payer: Self-pay

## 2019-09-17 ENCOUNTER — Ambulatory Visit (INDEPENDENT_AMBULATORY_CARE_PROVIDER_SITE_OTHER): Payer: Federal, State, Local not specified - PPO | Admitting: Family Medicine

## 2019-09-17 ENCOUNTER — Encounter: Payer: Self-pay | Admitting: Family Medicine

## 2019-09-17 VITALS — BP 109/52 | HR 81 | Temp 97.8°F | Resp 16 | Ht 68.0 in | Wt 156.0 lb

## 2019-09-17 DIAGNOSIS — H60332 Swimmer's ear, left ear: Secondary | ICD-10-CM | POA: Diagnosis not present

## 2019-09-17 DIAGNOSIS — H9202 Otalgia, left ear: Secondary | ICD-10-CM | POA: Diagnosis not present

## 2019-09-17 MED ORDER — CIPROFLOXACIN-DEXAMETHASONE 0.3-0.1 % OT SUSP
4.0000 [drp] | Freq: Two times a day (BID) | OTIC | 0 refills | Status: DC
Start: 2019-09-17 — End: 2019-11-11

## 2019-09-17 NOTE — Progress Notes (Signed)
Subjective:    Patient ID: Troy Butler, male    DOB: [birth year], 24 y.o.   MRN: [id number]  Troy Butler is a 24 y.o. male presenting on 09/17/2019 for Ear Pain (left side onset yesterday)  Patient presents for a same day appointment.  HPI   Left Ear Pain Prior history with ear infections and other ear problems, has had prior complicated AOM in past while flying on airplane, had severe popping and pain. He has not established with ENT. - He had been doing well recently until acute issue since yesterday. He went white water rafting for first time, said he did get water in ears, now has some pain in Left ear. He denies any head trauma or injury that would provoke symptoms acutely. Denies any discharge or drainage from ears Denies any fevers chills sweats nausea vomiting headache, hearing loss  Depression screen [facility] 2/9 05/18/2017 11/16/2016  Decreased Interest 0 0  Down, Depressed, Hopeless 0 0  PHQ - 2 Score 0 0    Social History   Tobacco Use  . Smoking status: Never Smoker  . Smokeless tobacco: Never Used  Vaping Use  . Vaping Use: Never used  Substance Use Topics  . Alcohol use: Yes    Alcohol/week: 1.0 standard drink    Types: 1 Cans of beer per week    Comment: Rare alcohol consumption  . Drug use: No    Review of Systems Per HPI unless specifically indicated above     Objective:    BP (!) 109/52   Pulse 81   Temp 97.8 F (36.6 C) (Temporal)   Resp 16   Ht 5\' 8"  (1.727 m)   Wt 156 lb (70.8 kg)   SpO2 98%   BMI 23.72 kg/m   Wt Readings from Last 3 Encounters:  09/17/19 156 lb (70.8 kg)  05/18/17 158 lb (71.7 kg)  11/16/16 167 lb (75.8 kg)    Physical Exam Vitals and nursing note reviewed.  Constitutional:      General: He is not in acute distress.    Appearance: He is well-developed. He is not diaphoretic.     Comments: Well-appearing, comfortable, cooperative  HENT:     Head: Normocephalic and atraumatic.     Right Ear: Tympanic membrane, ear  canal and external ear normal. There is no impacted cerumen.     Left Ear: External ear normal. There is no impacted cerumen.     Ears:     Comments: Left ear canal with some mild flaky appearing debris, no obvious impacting cerumen. No erythema. TM appears abnormal anatomy with some fullness bulging. No obvious perforation. No purulence.  External ear and mastoid non tender  Hearing is intact Eyes:     General:        Right eye: No discharge.        Left eye: No discharge.     Conjunctiva/sclera: Conjunctivae normal.  Cardiovascular:     Rate and Rhythm: Normal rate.  Pulmonary:     Effort: Pulmonary effort is normal.  Skin:    General: Skin is warm and dry.     Findings: No erythema or rash.  Neurological:     Mental Status: He is alert and oriented to person, place, and time.  Psychiatric:        Behavior: Behavior normal.     Comments: Well groomed, good eye contact, normal speech and thoughts         Results for orders placed  or performed in visit on 05/18/17  Lipid panel  Result Value Ref Range   Cholesterol 160 <200 mg/dL   HDL 45 >32 mg/dL   Triglycerides 43 <122 mg/dL   LDL Cholesterol (Calc) 102 (H) mg/dL (calc)   Total CHOL/HDL Ratio 3.6 <5.0 (calc)   Non-HDL Cholesterol (Calc) 115 <130 mg/dL (calc)  CBC with Differential/Platelet  Result Value Ref Range   WBC 5.2 3.8 - 10.8 Thousand/uL   RBC 5.56 4.20 - 5.80 Million/uL   Hemoglobin 16.4 13.2 - 17.1 g/dL   HCT 48.2 38 - 50 %   MCV 86.3 80.0 - 100.0 fL   MCH 29.5 27.0 - 33.0 pg   MCHC 34.2 32.0 - 36.0 g/dL   RDW 50.0 37.0 - 48.8 %   Platelets 232 140 - 400 Thousand/uL   MPV 10.6 7.5 - 12.5 fL   Neutro Abs 2,543 1,500 - 7,800 cells/uL   Lymphs Abs 1,882 850 - 3,900 cells/uL   WBC mixed population 551 200 - 950 cells/uL   Eosinophils Absolute 151 15 - 500 cells/uL   Basophils Absolute 73 0 - 200 cells/uL   Neutrophils Relative % 48.9 %   Total Lymphocyte 36.2 %   Monocytes Relative 10.6 %    Eosinophils Relative 2.9 %   Basophils Relative 1.4 %  COMPLETE METABOLIC PANEL WITH GFR  Result Value Ref Range   Glucose, Bld 90 65 - 99 mg/dL   BUN 16 7 - 25 mg/dL   Creat 8.91 6.94 - 5.03 mg/dL   GFR, Est Non African American 100 > OR = 60 mL/min/1.58m2   GFR, Est African American 116 > OR = 60 mL/min/1.77m2   BUN/Creatinine Ratio NOT APPLICABLE 6 - 22 (calc)   Sodium 140 135 - 146 mmol/L   Potassium 4.5 3.5 - 5.3 mmol/L   Chloride 104 98 - 110 mmol/L   CO2 30 20 - 32 mmol/L   Calcium 9.6 8.6 - 10.3 mg/dL   Total Protein 7.0 6.1 - 8.1 g/dL   Albumin 4.7 3.6 - 5.1 g/dL   Globulin 2.3 1.9 - 3.7 g/dL (calc)   AG Ratio 2.0 1.0 - 2.5 (calc)   Total Bilirubin 0.8 0.2 - 1.2 mg/dL   Alkaline phosphatase (APISO) 66 40 - 115 U/L   AST 19 10 - 40 U/L   ALT 17 9 - 46 U/L      Assessment & Plan:   Problem List Items Addressed This Visit    None    Visit Diagnoses    Acute swimmer's ear of left side    -  Primary   Relevant Medications   ciprofloxacin-dexamethasone (CIPRODEX) OTIC suspension   Otalgia of left ear          Meds ordered this encounter  Medications  . ciprofloxacin-dexamethasone (CIPRODEX) OTIC suspension    Sig: Place 4 drops into both ears 2 (two) times daily. Stop use if worsening pain in Left ear.    Dispense:  7.5 mL    Refill:  0   Suspected Left otitis externa with some mild debris in ear canal and possibly pain with pressure on ear drum possibly serous otitis media without purulence.  No hearing loss. No systemic symptoms and afebrile.  Plan: 1. Start Ciprodex otic antibiotic/steroid drops Left ear 4 drops BID x 7 days - may use on R ear as well if causing some pain. CAUTION advised if any significant worsening pain or problem with ear drops within first 24-48 hours, or feeling  of sudden loss of hearing or perforation, he should STOP ear drops and follow-up promptly. We would consider refer to [facility] sooner if not improving as expected 3.  May take  NSAID / Tylenol PRN pain 4. Avoid water in ear, no Q-tips. 5. RTC 10 days ear re-check if not resolved     Follow up plan: Return if symptoms worsen or fail to improve, for ear pain.   Saralyn Pilar, DO [facility] 09/17/2019, 11:44 AM

## 2019-09-17 NOTE — Patient Instructions (Addendum)
Thank you for coming to the office today.  Looks like may have early changes of a swimmers ear Left side  Use the ear drops as prescribed, use very cautiously for Left ear, I see some prior changes to Left ear drum that may contribute to your pain and symptoms now.  If worse pain or difficulty hearing or problem with using the ear drops on Left ear, then STOP using them immediately and contact us and we can refer you to an ENT specialist  [facility] [address]  [place], [place] [address] Ph: [phone]   Please schedule a Follow-up Appointment to: Return if symptoms worsen or fail to improve, for ear pain.  If you have any other questions or concerns, please feel free to call the office or send a message through MyChart. You may also schedule an earlier appointment if necessary.  Additionally, you may be receiving a survey about your experience at our office within a few days to 1 week by e-mail or mail. We value your feedback.  Saralyn Pilar, DO [facility], [place]

## 2019-11-11 ENCOUNTER — Other Ambulatory Visit: Payer: Self-pay

## 2019-11-11 ENCOUNTER — Telehealth (INDEPENDENT_AMBULATORY_CARE_PROVIDER_SITE_OTHER): Payer: Federal, State, Local not specified - PPO | Admitting: Family Medicine

## 2019-11-11 ENCOUNTER — Encounter: Payer: Self-pay | Admitting: Family Medicine

## 2019-11-11 DIAGNOSIS — J029 Acute pharyngitis, unspecified: Secondary | ICD-10-CM | POA: Insufficient documentation

## 2019-11-11 NOTE — Progress Notes (Signed)
Virtual Visit via MyChart Video Visit  The purpose of this virtual visit is to provide medical care while limiting exposure to the novel coronavirus (COVID19) for both patient and office staff.  Consent was obtained for phone visit:  Yes.   Answered questions that patient had about telehealth interaction:  Yes.   I discussed the limitations, risks, security and privacy concerns of performing an evaluation and management service by telephone. I also discussed with the patient that there may be a patient responsible charge related to this service. The patient expressed understanding and agreed to proceed.  Patient is at home and is accessed via [employer] Visit Services are provided by Charlaine Dalton, FNP-C from [facility])  ---------------------------------------------------------------------- Chief Complaint  Patient presents with  . Sore Throat    constant sore throat, no difficulty swallowing, pain worsen when he talks a lot x 2.5 weeks. Pt state he tired some allergies medication, but didn't notice any improvement. He admits that he didn't take it on a consistent bases.    S: Reviewed CMA documentation. I have called patient and gathered additional HPI as follows:  Troy Butler presents to MyChart Video visit with concerns of constant sore throat x 2.5 weeks.  Has taken zyrtec intermittently without improvement in his symptoms.  Noticed it is worsened when he is talking for a prolonged period of time.  Denies any difficulty swallowing, difficulty breathing, fever, change in taste/smell, lymphadenopathy, post nasal drainage, rhinorrhea, cough, SOB, DOE, CP, abdominal pain, body aches, n/v/d.  Has taken some cough drops that temporarily alleviates his symptoms.  Patient is currently home Denies any high risk travel to areas of current concern for COVID19. Denies any known or suspected exposure to person with or possibly with COVID19.  History reviewed. No  pertinent past medical history. Social History   Tobacco Use  . Smoking status: Never Smoker  . Smokeless tobacco: Never Used  Vaping Use  . Vaping Use: Never used  Substance Use Topics  . Alcohol use: Yes    Alcohol/week: 1.0 standard drink    Types: 1 Cans of beer per week    Comment: Rare alcohol consumption  . Drug use: No   No current outpatient medications on file.  Depression screen [facility] 2/9 05/18/2017 11/16/2016  Decreased Interest 0 0  Down, Depressed, Hopeless 0 0  PHQ - 2 Score 0 0    GAD 7 : Generalized Anxiety Score 11/16/2016  Nervous, Anxious, on Edge 0  Control/stop worrying 0  Worry too much - different things 0  Trouble relaxing 0  Restless 0  Easily annoyed or irritable 0  Afraid - awful might happen 0  Total GAD 7 Score 0  Anxiety Difficulty Not difficult at all    -------------------------------------------------------------------------- O: No physical exam performed due to remote telephone encounter.  Physical Exam: Patient remotely monitored with video.  Verbal communication appropriate.  Cognition normal.  No results found for this or any previous visit (from the past 2160 hour(s)).  -------------------------------------------------------------------------- A&P:  Problem List Items Addressed This Visit      Other   Sore throat - Primary    Sore throat x 2.5 weeks without improvement.  Will have stop by clinic and have staff obtain rapid strep and throat culture to send to lab for evaluation.  Will treat symptomatically until results of POCT strep and/or throat culture resulted.  Discussed warm salt water gargles, gargling with apple cider vinegar, cough drops.  Plan: 1. Come to  clinic for POCT rapid strep and throat culture 2. Treat symptomatically until additional results are received from testing 3. RTC PRN      Relevant Orders   Upper Respiratory Culture      No orders of the defined types were placed in this  encounter.   Follow-up: - Return in if symptoms worsen or fail to improve  Patient verbalizes understanding with the above medical recommendations including the limitation of remote medical advice.  Specific follow-up and call-back criteria were given for patient to follow-up or seek medical care more urgently if needed.  - Time spent in direct consultation with patient on video: 6 minutes  Charlaine Dalton, FNP-C [facility] 11/11/2019, 10:18 AM

## 2019-11-11 NOTE — Assessment & Plan Note (Signed)
Sore throat x 2.5 weeks without improvement.  Will have stop by clinic and have staff obtain rapid strep and throat culture to send to lab for evaluation.  Will treat symptomatically until results of POCT strep and/or throat culture resulted.  Discussed warm salt water gargles, gargling with apple cider vinegar, cough drops.  Plan: 1. Come to clinic for POCT rapid strep and throat culture 2. Treat symptomatically until additional results are received from testing 3. RTC PRN

## 2019-11-11 NOTE — Patient Instructions (Addendum)
As we discussed, come to clinic for rapid point of care test for strep throat and throat culture.  We will contact you when we receive the results.  Can treat symptomatically until the results are received, use warm salt water gargles, can gargle with apple cider vinegar and use cough drops with benzocaine.  May find some benefit with using a cloroseptic spray.  We will plan to see you back if your symptoms worsen or fail to improve  You will receive a survey after today's visit either digitally by e-mail or paper by USPS mail. Your experiences and feedback matter to [place].  Please respond so we know how we are doing as we provide care for you.  Call us with any questions/concerns/needs.  It is my goal to be available to you for your health concerns.  Thanks for choosing me to be a partner in your healthcare needs!  Charlaine Dalton, FNP-C Family Nurse Practitioner [facility] Phone: [phone]

## 2019-11-13 LAB — CULTURE, UPPER RESPIRATORY
MICRO NUMBER:: 11027247
SPECIMEN QUALITY:: ADEQUATE

## 2019-12-03 ENCOUNTER — Other Ambulatory Visit: Payer: Self-pay

## 2019-12-03 ENCOUNTER — Ambulatory Visit (INDEPENDENT_AMBULATORY_CARE_PROVIDER_SITE_OTHER): Payer: Federal, State, Local not specified - PPO | Admitting: Family Medicine

## 2019-12-03 ENCOUNTER — Encounter: Payer: Self-pay | Admitting: Family Medicine

## 2019-12-03 VITALS — BP 114/56 | HR 83 | Temp 98.0°F | Resp 16 | Ht 68.0 in | Wt 155.0 lb

## 2019-12-03 DIAGNOSIS — J312 Chronic pharyngitis: Secondary | ICD-10-CM

## 2019-12-03 DIAGNOSIS — K219 Gastro-esophageal reflux disease without esophagitis: Secondary | ICD-10-CM

## 2019-12-03 MED ORDER — OMEPRAZOLE 40 MG PO CPDR: 40.0000 mg | DELAYED_RELEASE_CAPSULE | Freq: Every day | ORAL | 1 refills | Status: DC

## 2019-12-03 NOTE — Progress Notes (Signed)
Subjective:    Patient ID: Troy Butler, male    DOB: 1995-06-27, 24 y.o.   MRN: [id number]  Troy Butler is a 24 y.o. male presenting on 12/03/2019 for Sore Throat (had strep test done negative --Zyrtec not improving --could be Acid reflux as per previous my chart messsage)   HPI  SORE THROAT, persistent / Possible GERD - Last visit with our office for this problem 11/11/19, sore throat, video visit with Danielle Rankin, FNP, had rapid strep negative, supportive care, see prior notes for background information. - Interval update with no improvement, he tried allergy med zyrtec as well - Today patient reports persistent problem. Describe sore throat for past several weeks to 1-2 month, he said he feels a constant mild dull pain, sometimes can be more severe or worse, usually worse with prolonged talking, eating and drinking do not cause any pain. - He has tried allergy medicine - He has not had any sick symptoms, headache, congestion cough, post nasal drainage, hoarse voice - He has used OTC lozenge for cough drop with some relief. - He has not noticed anything abnormal in back of his throat - Admits some indigestion and heartburn occasionally - Denies any burping belching or gas, change in taste or smell  Health Maintenance: UTD COVID vaccine Declines flu shot  Depression screen [facility] 2/9 05/18/2017 11/16/2016  Decreased Interest 0 0  Down, Depressed, Hopeless 0 0  PHQ - 2 Score 0 0    Social History   Tobacco Use  . Smoking status: Never Smoker  . Smokeless tobacco: Never Used  Vaping Use  . Vaping Use: Never used  Substance Use Topics  . Alcohol use: Yes    Alcohol/week: 1.0 standard drink    Types: 1 Cans of beer per week    Comment: Rare alcohol consumption  . Drug use: No    Review of Systems Per HPI unless specifically indicated above     Objective:    BP (!) 114/56   Pulse 83   Temp 98 F (36.7 C) (Temporal)   Resp 16   Ht 5\' 8"  (1.727 m)   Wt 155 lb  (70.3 kg)   SpO2 98%   BMI 23.57 kg/m   Wt Readings from Last 3 Encounters:  12/03/19 155 lb (70.3 kg)  09/17/19 156 lb (70.8 kg)  05/18/17 158 lb (71.7 kg)    Physical Exam Vitals and nursing note reviewed.  Constitutional:      General: He is not in acute distress.    Appearance: He is well-developed. He is not diaphoretic.     Comments: Well-appearing, comfortable, cooperative  HENT:     Head: Normocephalic and atraumatic.     Right Ear: Tympanic membrane and ear canal normal. No drainage, swelling or tenderness. No middle ear effusion.     Left Ear: Tympanic membrane and ear canal normal. No drainage, swelling or tenderness.  No middle ear effusion.     Nose: No congestion or rhinorrhea.     Mouth/Throat:     Mouth: Mucous membranes are moist. No oral lesions.     Pharynx: Oropharynx is clear. No pharyngeal swelling, oropharyngeal exudate, posterior oropharyngeal erythema or uvula swelling.     Tonsils: No tonsillar exudate or tonsillar abscesses. 0 on the right. 0 on the left.  Eyes:     General:        Right eye: No discharge.        Left eye: No discharge.  Conjunctiva/sclera: Conjunctivae normal.  Neck:     Thyroid: No thyromegaly.  Cardiovascular:     Rate and Rhythm: Normal rate and regular rhythm.     Heart sounds: Normal heart sounds. No murmur heard.   Pulmonary:     Effort: Pulmonary effort is normal. No respiratory distress.     Breath sounds: Normal breath sounds. No wheezing or rales.  Musculoskeletal:        General: Normal range of motion.     Cervical back: Normal range of motion and neck supple.  Lymphadenopathy:     Cervical: No cervical adenopathy.  Skin:    General: Skin is warm and dry.     Findings: No erythema or rash.  Neurological:     Mental Status: He is alert and oriented to person, place, and time.  Psychiatric:        Behavior: Behavior normal.     Comments: Well groomed, good eye contact, normal speech and thoughts    Results  for orders placed or performed in visit on 11/11/19  Upper Respiratory Culture  Result Value Ref Range   MICRO NUMBER: [id number]    SPECIMEN QUALITY: Adequate    Source NOT GIVEN    STATUS: FINAL    Result: No oropharyngeal pathogens recovered.       Assessment & Plan:   Problem List Items Addressed This Visit    None    Visit Diagnoses    Chronic sore throat    -  Primary   Relevant Medications   omeprazole (PRILOSEC) 40 MG capsule   Laryngopharyngeal reflux (LPR)       Relevant Medications   omeprazole (PRILOSEC) 40 MG capsule      Suspected silent reflux with laryngopharyngeal reflux symptoms now with more persistent sore throat sensation not related to swallowing. No significant oropharyngeal risk factors (non smoker, no large amounts of alcohol). - No GI red flag symptoms - Exam is unremarkable - no sign of acute pharyngitis or infection or sinus or other identified cause - History of GERD, never treated  Plan: 1. Start rx Omeprazole 40mg  daily 30 min prior to 1st meal for up to initial 4 weeks, may continue course or taper as advised 2. Diet modifications reduce GERD, elevated head of bed 3. Follow-up 4 weeks if not improved, we can consider ENT next option if ultimately GERD is not the answer.   Meds ordered this encounter  Medications  . omeprazole (PRILOSEC) 40 MG capsule    Sig: Take 1 capsule (40 mg total) by mouth daily before breakfast.    Dispense:  30 capsule    Refill:  1      Follow up plan: Return in about 4 weeks (around 12/31/2019), or if symptoms worsen or fail to improve, for 4 weeks as needed sore throat GERD if not improved.   01/02/2020, DO [facility] 12/03/2019, 9:55 AM

## 2019-12-03 NOTE — Patient Instructions (Addendum)
Thank you for coming to the office today.  Your symptoms sound most consistent with Silent Reflux or (Laryngopharyngeal Reflux), this is similar to Acid Reflux (or GERD) but usually involves the Throat and has a variety of symptoms including a "globus sensation", or air bubble or pressure in throat. Commonly occurs in patients who have had some symptoms of traditional heartburn before, but this can occur even when not eating spicy foods. - Start Omeprazole 40mg  - Take one capsule 30 min before first meal of day, same time every day for at least 2-4weeks, may taper down on it gradually to avoid rebound. If it resolves but then comes back again, you can repeat the 2-4 week course again as needed. - Avoid spicy, greasy, fried foods, also things like caffeine, dark chocolate, peppermint can worsen - Avoid large meals and late night snacks, also do not go more than 4-5 hours without a snack or meal (not eating will worsen reflux symptoms due to stomach acid)  If the problem improves but keeps coming back, we can discuss higher dose or longer course at next visit.  If symptoms are worsening, persistent symptoms despite treatment or develop esophageal or abdominal pain, unable to swallow solids or liquids, nausea, vomiting, fever/chills, or unintentional weight loss / no appetite, please follow-up sooner or seek more immediate medical attention.  Referral to ENT in future if not improved call or message  Please schedule a Follow-up Appointment to: Return in about 4 weeks (around 12/31/2019), or if symptoms worsen or fail to improve, for 4 weeks as needed sore throat GERD if not improved.  If you have any other questions or concerns, please feel free to call the office or send a message through MyChart. You may also schedule an earlier appointment if necessary.  Additionally, you may be receiving a survey about your experience at our office within a few days to 1 week by e-mail or mail. We value your  feedback.  01/02/2020, DO [facility], [facility]

## 2019-12-23 DIAGNOSIS — Z03818 Encounter for observation for suspected exposure to other biological agents ruled out: Secondary | ICD-10-CM | POA: Diagnosis not present

## 2019-12-23 DIAGNOSIS — Z1152 Encounter for screening for COVID-19: Secondary | ICD-10-CM | POA: Diagnosis not present

## 2019-12-26 DIAGNOSIS — F458 Other somatoform disorders: Secondary | ICD-10-CM | POA: Diagnosis not present

## 2019-12-26 DIAGNOSIS — K219 Gastro-esophageal reflux disease without esophagitis: Secondary | ICD-10-CM | POA: Diagnosis not present

## 2020-01-09 DIAGNOSIS — Z03818 Encounter for observation for suspected exposure to other biological agents ruled out: Secondary | ICD-10-CM | POA: Diagnosis not present

## 2020-01-09 DIAGNOSIS — Z1152 Encounter for screening for COVID-19: Secondary | ICD-10-CM | POA: Diagnosis not present

## 2021-01-18 ENCOUNTER — Encounter: Payer: Self-pay | Admitting: Family Medicine

## 2021-01-22 ENCOUNTER — Ambulatory Visit (INDEPENDENT_AMBULATORY_CARE_PROVIDER_SITE_OTHER): Payer: Federal, State, Local not specified - PPO | Admitting: Family Medicine

## 2021-01-22 ENCOUNTER — Encounter: Payer: Self-pay | Admitting: Family Medicine

## 2021-01-22 ENCOUNTER — Other Ambulatory Visit: Payer: Self-pay

## 2021-01-22 VITALS — BP 125/59 | HR 86 | Ht 67.0 in | Wt 160.8 lb

## 2021-01-22 DIAGNOSIS — G43901 Migraine, unspecified, not intractable, with status migrainosus: Secondary | ICD-10-CM

## 2021-01-22 DIAGNOSIS — G43909 Migraine, unspecified, not intractable, without status migrainosus: Secondary | ICD-10-CM | POA: Diagnosis not present

## 2021-01-22 MED ORDER — RIZATRIPTAN BENZOATE 10 MG PO TBDP: 10.0000 mg | ORAL_TABLET | ORAL | 2 refills | Status: AC | PRN

## 2021-01-22 MED ORDER — RIZATRIPTAN BENZOATE 10 MG PO TABS
10.0000 mg | ORAL_TABLET | ORAL | 2 refills | Status: DC | PRN
Start: 2021-01-22 — End: 2021-01-22

## 2021-01-22 NOTE — Patient Instructions (Addendum)
Thank you for coming to the office today.  1. You most likely have Chronic Migraine Headaches - Migraine headaches present differently for many patients, pain is usually throbbing or aching, often on one side of head or behind the eye. They tend to last for up to hours or days. In treating migraines, our goal is to 1) stop the headache and 2) prevent recurrence of headaches  Treatment to STOP the headache at this time: - Start with Rizatriptan 10mg  - take 1 immediately at onset of moderate to severe migraine headache, if unresolved or return within 2 hours then repeat dose 1 tablet, that is max dose for 24 hours. (Note - this medication can cause a brief episode of flushing and chest pressure or pain very soon after taking it. That is NORMAL, and it is the medicine taking effect and dilating some blood vessels. It should pass, and resolve within seconds to minutes after - it may not happen at all)  - Try this for 1-2 weeks, if absolutely NOT helping then contact me to discuss changes, possibly can double sumatriptan dose or try nasal spray - You can still take over the counter meds with Ibuprofen up to 600-800mg  per dose 3 times a day with food for a few days and may try Excedrin Migraine as needed, ONLY use these after you have tried Rizatriptan up to 2 doses, and try to limit their use if they are not effective   Treatment to PREVENT headaches: - Goal is to avoid triggers. We need to learn more details on what are your exact or possible headache triggers first. - Keep detailed headache diary (on printed handout) for possible triggers, bring this to your next visit to discuss further - Known possible triggers include caffeine, chocolate, alcohol, stress, weather changes, menstrual cycle, certain other foods - Also be aware that OTC pain meds/anti-inflammatories can cause rebound headache, they help resolve the headache but then after the effect wears off they can CAUSE a headache. Try to taper down  and stop these medications and allow them to get out of your system for 1-2 weeks   Look into some of these alternatives for Migraine Headache Prophylaxis or preventative treatment  Antidepressant Type - Venlafaxine, Amitriptyline Blood pressure meds - Metoprolol, Propanolol, Verapamil Anti Headache/Seizure/Nerve medications - Topamax, Gabapentin  OTHER Abortive med called NURTEC ODT is highly recommended if this first rx is unsuccessful.  Future medication options that can be considered if persistent severe migraines and if failed some of the above prevention medicines include: - Aimovig, Emgality, Ajovy  (these are once a month self injection medications that work directly to block migraine signals. They are relatively new, can be costly and have been proven to be very safe and effective. We can try to get them approved or for low cost if needed in future.)   Please schedule a Follow-up Appointment to: Return in about 3 months (around 04/22/2021) for 3 month follow-up Migraine (in person or virtual).  If you have any other questions or concerns, please feel free to call the office or send a message through MyChart. You may also schedule an earlier appointment if necessary.  Additionally, you may be receiving a survey about your experience at our office within a few days to 1 week by e-mail or mail. We value your feedback.  04/24/2021, DO [facility], [facility]

## 2021-01-22 NOTE — Progress Notes (Signed)
Subjective:    Patient ID: Troy Butler, male    DOB: 1995/07/14, 25 y.o.   MRN: [id number]  Troy Butler is a 25 y.o. male presenting on 01/22/2021 for Headache   HPI  Suspected Migraine episodic  Chronic problem in the past, since teenager he has had sporadic headaches, frequency about 1-3 times per month at that time, whole head throbbing, he may have some light and sound sensitivity, took ibuprofen PRN with relief, and sleep. He has never been formally diagnosed with migraine. But he does endorsed he feels it is a migraine headache before that he has had. - Did not endorse much nausea vomiting or other assoc features with them. - He has fam history mother has history of migraines  New acute persistent headache  Location: frontal bilateral Quality: pinching, not as throbbing, more of a vice grip Duration of headache without treatment: 3+ days, onset even in morning and present throughout the day Note he has not had a complete headache free day since this started past week. But has eased at times. Severity: moderate pain, mostly uncomfortable and bothersome, but not severe pain.  Accompanying symptoms: No nausea, No vomiting, No photophobia, No phonophobia, No lacrimation, No rhinorrhea, No neurologic symptoms.  Effective treatment: Nothing Ineffective treatment: Tried Ibuprofen 200mg  x 2-3 once daily PRN, mild relief. History of headaches: see above History of imaging: None  Known triggers: Caffeine, recently taking in ZipFizz and it can cause a caffeine withdrawal headache. Sometimes poor hydration can trigger.    Depression screen [facility] 2/9 01/22/2021 05/18/2017 11/16/2016  Decreased Interest 0 0 0  Down, Depressed, Hopeless 0 0 0  PHQ - 2 Score 0 0 0  Altered sleeping 0 - -  Tired, decreased energy 0 - -  Change in appetite 0 - -  Feeling bad or failure about yourself  0 - -  Trouble concentrating 0 - -  Moving slowly or fidgety/restless 0 - -  Suicidal thoughts 0 -  -  PHQ-9 Score 0 - -  Difficult doing work/chores Not difficult at all - -    Social History   Tobacco Use   Smoking status: Never   Smokeless tobacco: Never  Vaping Use   Vaping Use: Never used  Substance Use Topics   Alcohol use: Yes    Alcohol/week: 1.0 standard drink    Types: 1 Cans of beer per week    Comment: Rare alcohol consumption   Drug use: No    Review of Systems Per HPI unless specifically indicated above     Objective:    BP (!) 125/59    Pulse 86    Ht 5\' 7"  (1.702 m)    Wt 160 lb 12.8 oz (72.9 kg)    SpO2 97%    BMI 25.18 kg/m   Wt Readings from Last 3 Encounters:  01/22/21 160 lb 12.8 oz (72.9 kg)  12/03/19 155 lb (70.3 kg)  09/17/19 156 lb (70.8 kg)    Physical Exam Vitals and nursing note reviewed.  Constitutional:      General: He is not in acute distress.    Appearance: He is well-developed. He is not diaphoretic.     Comments: Well-appearing, comfortable, cooperative  HENT:     Head: Normocephalic and atraumatic.  Eyes:     General: No visual field deficit.       Right eye: No discharge.        Left eye: No discharge.     Conjunctiva/sclera:  Conjunctivae normal.  Neck:     Thyroid: No thyromegaly.  Cardiovascular:     Rate and Rhythm: Normal rate and regular rhythm.     Pulses: Normal pulses.     Heart sounds: Normal heart sounds. No murmur heard. Pulmonary:     Effort: Pulmonary effort is normal. No respiratory distress.     Breath sounds: Normal breath sounds. No wheezing or rales.  Musculoskeletal:        General: Normal range of motion.     Cervical back: Normal range of motion and neck supple.  Lymphadenopathy:     Cervical: No cervical adenopathy.  Skin:    General: Skin is warm and dry.     Findings: No erythema or rash.  Neurological:     Mental Status: He is alert and oriented to person, place, and time. Mental status is at baseline.     Cranial Nerves: No cranial nerve deficit.     Sensory: No sensory deficit.      Motor: No weakness.     Gait: Gait normal.  Psychiatric:        Behavior: Behavior normal.     Comments: Well groomed, good eye contact, normal speech and thoughts   Results for orders placed or performed in visit on 11/11/19  Upper Respiratory Culture  Result Value Ref Range   MICRO NUMBER: [id number]    SPECIMEN QUALITY: Adequate    Source NOT GIVEN    STATUS: FINAL    Result: No oropharyngeal pathogens recovered.       Assessment & Plan:   Problem List Items Addressed This Visit     Episodic migraine - Primary   Relevant Medications   rizatriptan (MAXALT) 10 MG tablet   Other Visit Diagnoses     Status migrainosus       Relevant Medications   rizatriptan (MAXALT) 10 MG tablet       Consistent with persistent migraine HA x 7 week without resolution seems to be status migrainosus Episodic migraine history over >10+ years however, new diagnosis no prior documentation or treatment Fam history migraines Inadequate treatment Currently with headache but more mild and no focal neuro deficits.  History without other major red flags. Concern with morning symptoms persistent headache.  Plan: 1. Start abortive therapy with Rizatriptan ODT 10mg  tabs - take 1 PRN (#9, 0 refill due to quantity limit), severe HA, may repeat dose within 2 hr if persistent, no more in 24 hours, in future can titrate dose as needed. Counseling on potential side effect / intolerance with chest discomfort acutely after taking sumatriptan 2. Recommend taking Ibuprofen 600-800mg  q 8 hr PRN, and can try Tylenol 1000mg  TID alternatively 3. Avoid triggers including foods, caffeine. Important to rest. - Discussion on future migraine prophylaxis medications - handout given, review options at next visit, determine need if using triptan frequently 4. Return criteria given for acute migraine, when to go to office vs ED  Consider future Nurtec ODT if approved if doing well on Triptan and prefer to adjust  medication.  If not improving notify me within 1 week. If doing well we can review course in 3 months approx discuss prophylaxis if need.   Meds ordered this encounter  Medications   rizatriptan (MAXALT) 10 MG tablet    Sig: Take 1 tablet (10 mg total) by mouth as needed for migraine. May repeat in 2 hours if needed    Dispense:  9 tablet    Refill:  2  Follow up plan: Return in about 3 months (around 04/22/2021) for 3 month follow-up Migraine (in person or virtual).  Saralyn Pilar, DO [facility] 01/22/2021, 1:28 PM

## 2021-01-25 ENCOUNTER — Ambulatory Visit: Payer: Federal, State, Local not specified - PPO | Admitting: Family Medicine

## 2021-01-26 ENCOUNTER — Encounter: Payer: Self-pay | Admitting: Family Medicine

## 2021-01-26 DIAGNOSIS — R6889 Other general symptoms and signs: Secondary | ICD-10-CM

## 2021-01-26 MED ORDER — OSELTAMIVIR PHOSPHATE 75 MG PO CAPS: 75.0000 mg | ORAL_CAPSULE | Freq: Two times a day (BID) | ORAL | 0 refills | Status: DC

## 2021-11-03 ENCOUNTER — Ambulatory Visit (INDEPENDENT_AMBULATORY_CARE_PROVIDER_SITE_OTHER): Payer: Federal, State, Local not specified - PPO

## 2021-11-03 ENCOUNTER — Ambulatory Visit
Admission: EM | Admit: 2021-11-03 | Discharge: 2021-11-03 | Disposition: A | Discharge status: Home / Self Care | Payer: Federal, State, Local not specified - PPO | Attending: Emergency Medicine | Admitting: Emergency Medicine

## 2021-11-03 DIAGNOSIS — R1030 Lower abdominal pain, unspecified: Secondary | ICD-10-CM | POA: Insufficient documentation

## 2021-11-03 LAB — URINALYSIS, ROUTINE W REFLEX MICROSCOPIC
Bilirubin Urine: NEGATIVE
Glucose, UA: NEGATIVE mg/dL
Hgb urine dipstick: NEGATIVE
Ketones, ur: NEGATIVE mg/dL
Leukocytes,Ua: NEGATIVE
Nitrite: NEGATIVE
Protein, ur: NEGATIVE mg/dL
Specific Gravity, Urine: 1.02 (ref 1.005–1.030)
pH: 7 (ref 5.0–8.0)

## 2021-11-03 NOTE — ED Provider Notes (Signed)
[facility]    CSN: [id number] Arrival date & time: 11/03/21  1213      History   Chief Complaint Chief Complaint  Patient presents with   Abdominal Pain    HPI Troy Butler is a 26 y.o. male.   HPI  26 year old male here for evaluation of abdominal pain.  Patient reports that he has been experiencing pain in his lower abdomen for the past 5 days.  He states that it is constant and describes it as a soreness and rates it a 3/10.  This has been associated with some bloating and he states that the pain increases after meals.  He has been having bowel movements but states that they are little less than what he normally has.  He denies any fever, nausea, vomiting, diarrhea, painful urination or urinary urgency or frequency.  He also denies any penile discharge or testicular pain.  History reviewed. No pertinent past medical history.  Patient Active Problem List   Diagnosis Date Noted   Episodic migraine 01/22/2021   Chest wall pain, chronic 11/16/2016    Past Surgical History:  Procedure Laterality Date   WISDOM TOOTH EXTRACTION         Home Medications    Prior to Admission medications   Medication Sig Start Date End Date Taking? Authorizing Provider  oseltamivir (TAMIFLU) 75 MG capsule Take 1 capsule (75 mg total) by mouth 2 (two) times daily. For 5 days 01/26/21   Smitty Cords, DO  rizatriptan (MAXALT-MLT) 10 MG disintegrating tablet Take 1 tablet (10 mg total) by mouth as needed for migraine. May repeat in 2 hours if needed 01/22/21   Smitty Cords, DO    Family History Family History  Problem Relation Age of Onset   Prostate cancer Neg Hx    Colon cancer Neg Hx    Heart disease Neg Hx     Social History Social History   Tobacco Use   Smoking status: Never   Smokeless tobacco: Never  Vaping Use   Vaping Use: Never used  Substance Use Topics   Alcohol use: Yes    Alcohol/week: 1.0 standard drink of alcohol     Types: 1 Cans of beer per week    Comment: Rare alcohol consumption   Drug use: No     Allergies   Patient has no known allergies.   Review of Systems Review of Systems  Constitutional:  Negative for fever.  Gastrointestinal:  Positive for abdominal pain. Negative for blood in stool, constipation, diarrhea, nausea and vomiting.  Genitourinary:  Positive for urgency. Negative for dysuria, frequency, hematuria, penile pain, scrotal swelling and testicular pain.  Musculoskeletal:  Negative for back pain.  Skin:  Negative for rash.  Hematological: Negative.   Psychiatric/Behavioral: Negative.       Physical Exam Triage Vital Signs ED Triage Vitals  Enc Vitals Group     BP 11/03/21 1329 125/71     Pulse Rate 11/03/21 1329 82     Resp --      Temp 11/03/21 1329 98.4 F (36.9 C)     Temp Source 11/03/21 1329 Oral     SpO2 11/03/21 1329 99 %     Weight 11/03/21 1328 160 lb (72.6 kg)     Height 11/03/21 1328 5\' 8"  (1.727 m)     Head Circumference --      Peak Flow --      Pain Score 11/03/21 1328 3     Pain Loc --  Pain Edu? --      Excl. in [place]? --    No data found.  Updated Vital Signs BP 125/71 (BP Location: Left Arm)   Pulse 82   Temp 98.4 F (36.9 C) (Oral)   Ht 5\' 8"  (1.727 m)   Wt 160 lb (72.6 kg)   SpO2 99%   BMI 24.33 kg/m   Visual Acuity Right Eye Distance:   Left Eye Distance:   Bilateral Distance:    Right Eye Near:   Left Eye Near:    Bilateral Near:     Physical Exam Vitals and nursing note reviewed.  Constitutional:      Appearance: Normal appearance. He is not ill-appearing.  HENT:     Head: Normocephalic and atraumatic.  Cardiovascular:     Rate and Rhythm: Normal rate and regular rhythm.     Pulses: Normal pulses.     Heart sounds: Normal heart sounds. No murmur heard.    No friction rub. No gallop.  Pulmonary:     Effort: Pulmonary effort is normal.     Breath sounds: Normal breath sounds. No wheezing, rhonchi or rales.   Abdominal:     General: Abdomen is flat. Bowel sounds are normal.     Palpations: Abdomen is soft.     Tenderness: There is no abdominal tenderness. There is no right CVA tenderness, left CVA tenderness, guarding or rebound.  Skin:    General: Skin is warm and dry.     Capillary Refill: Capillary refill takes less than 2 seconds.     Findings: No erythema or rash.  Neurological:     General: No focal deficit present.     Mental Status: He is alert and oriented to person, place, and time.  Psychiatric:        Mood and Affect: Mood normal.        Behavior: Behavior normal.        Thought Content: Thought content normal.        Judgment: Judgment normal.      UC Treatments / Results  Labs (all labs ordered are listed, but only abnormal results are displayed) Labs Reviewed  URINALYSIS, ROUTINE W REFLEX MICROSCOPIC - Abnormal; Notable for the following components:      Result Value   APPearance HAZY (*)    All other components within normal limits    EKG   Radiology DG Abdomen 1 View  Result Date: 11/03/2021 CLINICAL DATA:  Lower abdominal pain and bloating for 5 days. EXAM: ABDOMEN - 1 VIEW COMPARISON:  None Available. FINDINGS: Nonobstructive bowel gas pattern. Small to moderate amount of stool in the right abdomen. Lung bases are clear. No evidence for calculi overlying the renal shadows or the expected course of the ureters. No acute bone abnormality. IMPRESSION: Nonobstructive bowel gas pattern. Small to moderate amount of stool in the right abdomen. Electronically Signed   By: Markus Daft M.D.   On: 11/03/2021 14:15    Procedures Procedures (including critical care time)  Medications Ordered in UC Medications - No data to display  Initial Impression / Assessment and Plan / UC Course  I have reviewed the triage vital signs and the nursing notes.  Pertinent labs & imaging results that were available during my care of the patient were reviewed by me and considered in my  medical decision making (see chart for details).   Patient is a nontoxic-appearing 6 old male here for evaluation of 5 days worth of lower abdominal pain  that is described as a constant soreness and is rated a 3/10.  This is associate with some bloating.  The pain and bloating to increase after meals.  Patient is having normal bowel movements but states they are slightly decreased over baseline.  No associated nausea or vomiting.  No diarrhea.  No urinary symptoms, penile discharge, testicular pain.  No fever.  On exam patient is benign cardiopulmonary exam with clear lung sounds in all fields.  No CVA tenderness on exam.  Abdomen soft, flat, nontender, with positive bowel sounds in all 4 quadrants.  Urinalysis was collected at triage which shows a hazy appearance but is negative for leukocyte esterase, nitrates, protein, or ketones.  I will obtain a KUB to look for the presence of constipation.  KUB independently reviewed and evaluated by me.  Impression: There is some stool present in the right upper quadrant of the abdomen.  Nonspecific air-fluid levels.  Radiology overread is pending. Radiology impression states nonobstructive bowel gas pattern with a small amount of stool in the right abdomen.  We will discharge patient on the diagnosis of lower abdominal pain and have him use over-the-counter Tylenol and ibuprofen to see if this makes a difference with his symptoms.  I will caution the patient that if his abdominal pain increases, becomes sharp, is associated with fever, or nausea or vomiting that he should return for reevaluation or seek care in the ER.   Final Clinical Impressions(s) / UC Diagnoses   Final diagnoses:  Lower abdominal pain     Discharge Instructions      The source of your abdominal pain is not clear but your urine did not show any signs of infection and there is no evidence of constipation on your abdominal x-ray.  You do not have a fever and your vital signs are  stable.  I would use over-the-counter Tylenol and ibuprofen according to the package instructions as needed for discomfort and see if this provides any relief of your symptoms.  There are no dietary restrictions at this point.  If your abdominal pain continues, worsens, is associated with fever, nausea, or vomiting please return for reevaluation or seek care in the emergency department.     ED Prescriptions   None    PDMP not reviewed this encounter.   Becky Augusta, NP 11/03/21 1435

## 2021-11-03 NOTE — Discharge Instructions (Signed)
The source of your abdominal pain is not clear but your urine did not show any signs of infection and there is no evidence of constipation on your abdominal x-ray.  You do not have a fever and your vital signs are stable.  I would use over-the-counter Tylenol and ibuprofen according to the package instructions as needed for discomfort and see if this provides any relief of your symptoms.  There are no dietary restrictions at this point.  If your abdominal pain continues, worsens, is associated with fever, nausea, or vomiting please return for reevaluation or seek care in the emergency department.

## 2021-11-03 NOTE — ED Triage Notes (Signed)
Patient reports to [facility] for lower Abdominal pain -- started Saturday.   Patient denies any urinary symptoms

## 2022-03-22 ENCOUNTER — Encounter: Payer: Self-pay | Admitting: Family Medicine

## 2022-03-23 ENCOUNTER — Telehealth (INDEPENDENT_AMBULATORY_CARE_PROVIDER_SITE_OTHER): Payer: Self-pay | Admitting: Family Medicine

## 2022-03-23 ENCOUNTER — Encounter: Payer: Self-pay | Admitting: Family Medicine

## 2022-03-23 VITALS — Ht 68.0 in | Wt 160.0 lb

## 2022-03-23 DIAGNOSIS — J3489 Other specified disorders of nose and nasal sinuses: Secondary | ICD-10-CM

## 2022-03-23 DIAGNOSIS — J029 Acute pharyngitis, unspecified: Secondary | ICD-10-CM

## 2022-03-23 DIAGNOSIS — R509 Fever, unspecified: Secondary | ICD-10-CM

## 2022-03-23 DIAGNOSIS — R6889 Other general symptoms and signs: Secondary | ICD-10-CM

## 2022-03-23 MED ORDER — OSELTAMIVIR PHOSPHATE 75 MG PO CAPS: 75.0000 mg | ORAL_CAPSULE | Freq: Two times a day (BID) | ORAL | 0 refills | Status: AC

## 2022-03-23 MED ORDER — IPRATROPIUM BROMIDE 0.06 % NA SOLN: 2.0000 | Freq: Four times a day (QID) | NASAL | 0 refills | Status: AC

## 2022-03-23 NOTE — Progress Notes (Signed)
Subjective:    Patient ID: BUFORD BEDSOLE, male    DOB: [birth year], 27 y.o.   MRN: [id number]  MARUS WOOLLEY is a 27 y.o. male presenting on 03/23/2022 for Generalized Body Aches  Virtual / Telehealth Encounter - Video Visit via MyChart The purpose of this virtual visit is to provide medical care while limiting exposure to the novel coronavirus (COVID19) for both patient and office staff.  Consent was obtained for remote visit:  Yes.   Answered questions that patient had about telehealth interaction:  Yes.   I discussed the limitations, risks, security and privacy concerns of performing an evaluation and management service by video/telephone. I also discussed with the patient that there may be a patient responsible charge related to this service. The patient expressed understanding and agreed to proceed.  Patient Location: Home Provider Location: [facility] (Office)  Participants in virtual visit: - Patient: DEWITTE KROGSTAD - CMA: Orinda Kenner, CMA - Provider: Dr Parks Ranger   HPI  Flu-Like Symptoms / Viral Syndrome Reports symptoms started on 2 days ago with fever chills temp 100*F He has sinus drainage congestion, sore throat Not much cough Similar to prior Flu. No sick contacts. He took tamiflu 2 years ago. No home covid test yet Denies nausea vomiting dyspnea wheezing coughing.      01/22/2021    1:21 PM 05/18/2017    9:20 AM 11/16/2016    2:14 PM  Depression screen PHQ 2/9  Decreased Interest 0 0 0  Down, Depressed, Hopeless 0 0 0  PHQ - 2 Score 0 0 0  Altered sleeping 0    Tired, decreased energy 0    Change in appetite 0    Feeling bad or failure about yourself  0    Trouble concentrating 0    Moving slowly or fidgety/restless 0    Suicidal thoughts 0    PHQ-9 Score 0    Difficult doing work/chores Not difficult at all      Social History   Tobacco Use   Smoking status: Never   Smokeless tobacco: Never  Vaping Use   Vaping Use: Never  used  Substance Use Topics   Alcohol use: Yes    Alcohol/week: 1.0 standard drink of alcohol    Types: 1 Cans of beer per week    Comment: Rare alcohol consumption   Drug use: No    Review of Systems Per HPI unless specifically indicated above     Objective:    Ht 5' 8"$  (1.727 m)   Wt 160 lb (72.6 kg)   BMI 24.33 kg/m   Wt Readings from Last 3 Encounters:  03/23/22 160 lb (72.6 kg)  11/03/21 160 lb (72.6 kg)  01/22/21 160 lb 12.8 oz (72.9 kg)    Physical Exam  Note examination was completely remotely via video observation objective data only  Gen - well-appearing, no acute distress or apparent pain, comfortable HEENT - eyes appear clear without discharge or redness Heart/Lungs - cannot examine virtually - observed no evidence of coughing or labored breathing. Abd - cannot examine virtually  Skin - face visible today- no rash Neuro - awake, alert, oriented Psych - not anxious appearing   Results for orders placed or performed during the hospital encounter of 11/03/21  Urinalysis, Routine w reflex microscopic Urine, Clean Catch  Result Value Ref Range   Color, Urine YELLOW YELLOW   APPearance HAZY (A) CLEAR   Specific Gravity, Urine 1.020 1.005 - 1.030  pH 7.0 5.0 - 8.0   Glucose, UA NEGATIVE NEGATIVE mg/dL   Hgb urine dipstick NEGATIVE NEGATIVE   Bilirubin Urine NEGATIVE NEGATIVE   Ketones, ur NEGATIVE NEGATIVE mg/dL   Protein, ur NEGATIVE NEGATIVE mg/dL   Nitrite NEGATIVE NEGATIVE   Leukocytes,Ua NEGATIVE NEGATIVE      Assessment & Plan:   Problem List Items Addressed This Visit   None Visit Diagnoses     Flu-like symptoms    -  Primary   Relevant Medications   oseltamivir (TAMIFLU) 75 MG capsule   ipratropium (ATROVENT) 0.06 % nasal spray   Sore throat           Clinically diagnosed influenza despite no testing today, virtual visit. - Duration x 2 days, without complication. Tolerating PO and well hydrated - Did not receive influenza vaccine this  season  No COVID test done, asked him to check today or tomorrow. Notify us if positive.   Plan: 1. Start Tamiflu 26m capsules BID x 5 days 2. Supportive care as advised with NSAID / Tylenol PRN fever/myalgias, improve hydration, may take OTC Cold/Flu meds 3. Start Atrovent nasal spray decongestant 2 sprays in each nostril up to 4 times daily for 7 days 4. Return criteria given if significant worsening, consider post-influenza complications, otherwise follow-up if needed   Meds ordered this encounter  Medications   oseltamivir (TAMIFLU) 75 MG capsule    Sig: Take 1 capsule (75 mg total) by mouth 2 (two) times daily. For 5 days    Dispense:  10 capsule    Refill:  0   ipratropium (ATROVENT) 0.06 % nasal spray    Sig: Place 2 sprays into both nostrils 4 (four) times daily. For up to 5-7 days then stop.    Dispense:  15 mL    Refill:  0     Follow up plan: Return if symptoms worsen or fail to improve, for flu like illness.   Patient verbalizes understanding with the above medical recommendations including the limitation of remote medical advice.  Specific follow-up and call-back criteria were given for patient to follow-up or seek medical care more urgently if needed.  Total duration of direct patient care provided via video conference: 10 minutes    ANobie Putnam [facility] 03/23/2022, 11:32 AM

## 2022-03-23 NOTE — Patient Instructions (Addendum)
Thank you for coming to the office today.  - Start Flu medicine today to cover for Flu  Recommend home COVID test. Let me know if positive  - For symptom control (these are optional OTC medicines)      - Take Ibuprofen / Advil 400-629m every 6-8 hours as needed for fever / muscle aches, and may also take Tylenol 500-10079mper dose every 6-8 hours or 3 times a day, can alternate dosing     - May try OTC Mucinex up to 7-10 days then stop  If prescribed for you      - Start Atrovent nasal spray decongestant 2 sprays in each nostril up to 4 times daily for 7 days  - Wash hands and cover cough very well to avoid spread of infection - Improve hydration with plenty of clear fluids    If significant worsening with poor fluid intake, worsening fever, difficulty breathing due to coughing, worsening body aches, weakness, or other more concerning symptoms difficulty breathing you can seek treatment at Emergency Department. Also if improved flu symptoms and then worsening days to week later with concerns for bronchitis, productive cough fever chills again we may need to check for possible pneumonia that can occur after the flu      Please schedule a Follow-up Appointment to: Return if symptoms worsen or fail to improve, for flu like illness.  If you have any other questions or concerns, please feel free to call the office or send a message through [facility] may also schedule an earlier appointment if necessary.  Additionally, you may be receiving a survey about your experience at our office within a few days to 1 week by e-mail or mail. We value your feedback.  AlNobie PutnamDO [facility]

## 2022-08-24 ENCOUNTER — Ambulatory Visit
Admission: EM | Admit: 2022-08-24 | Discharge: 2022-08-24 | Disposition: A | Discharge status: Home / Self Care | Payer: 59 | Attending: Family Medicine | Admitting: Family Medicine

## 2022-08-24 ENCOUNTER — Encounter: Payer: Self-pay | Admitting: Emergency Medicine

## 2022-08-24 DIAGNOSIS — Z23 Encounter for immunization: Secondary | ICD-10-CM

## 2022-08-24 DIAGNOSIS — S91114A Laceration without foreign body of right lesser toe(s) without damage to nail, initial encounter: Secondary | ICD-10-CM | POA: Diagnosis not present

## 2022-08-24 MED ORDER — TETANUS-DIPHTH-ACELL PERTUSSIS 5-2.5-18.5 LF-MCG/0.5 IM SUSY
0.5000 mL | PREFILLED_SYRINGE | Freq: Once | INTRAMUSCULAR | Status: AC
  Administered 2022-08-24: 0.5 mL via INTRAMUSCULAR

## 2022-08-24 MED ORDER — CEPHALEXIN 500 MG PO CAPS: 500.0000 mg | ORAL_CAPSULE | Freq: Three times a day (TID) | ORAL | 0 refills | Status: AC

## 2022-08-24 NOTE — Discharge Instructions (Addendum)
Keep the pressure dressing on for the next 3 days.  Take antibiotics as prescribed.  You were given a tetanus vaccine.

## 2022-08-24 NOTE — ED Triage Notes (Signed)
Pt presents with a laceration to his 2nd toe on his right foot yesterday.

## 2022-08-24 NOTE — ED Provider Notes (Signed)
[facility]    CSN: [id number] Arrival date & time: 08/24/22  4098      History   Chief Complaint Chief Complaint  Patient presents with   Laceration    HPI Jeryl LANDYNN DUPLER is a 27 y.o. male.   HPI  Maxim[place] presents for right second toe laceration that occurred around noon yesterday.  Patient wrapped the toe and helped that it was stopped bleeding but it did not so therefore he came to the urgent care.  Notes that he did not clean the wound.  He was sitting on a desk and somehow injured his foot.  He is unsure when his last tetanus is.  Taino has otherwise been well and has no additional concerns today.    History reviewed. No pertinent past medical history.  Patient Active Problem List   Diagnosis Date Noted   Episodic migraine 01/22/2021   Chest wall pain, chronic 11/16/2016    Past Surgical History:  Procedure Laterality Date   WISDOM TOOTH EXTRACTION         Home Medications    Prior to Admission medications   Medication Sig Start Date End Date Taking? Authorizing Provider  cephALEXin (KEFLEX) 500 MG capsule Take 1 capsule (500 mg total) by mouth 3 (three) times daily. 08/24/22  Yes Olivia Royse, DO  ipratropium (ATROVENT) 0.06 % nasal spray Place 2 sprays into both nostrils 4 (four) times daily. For up to 5-7 days then stop. 03/23/22   Karamalegos, Netta Neat, DO  oseltamivir (TAMIFLU) 75 MG capsule Take 1 capsule (75 mg total) by mouth 2 (two) times daily. For 5 days 03/23/22   Smitty Cords, DO  rizatriptan (MAXALT-MLT) 10 MG disintegrating tablet Take 1 tablet (10 mg total) by mouth as needed for migraine. May repeat in 2 hours if needed 01/22/21   Smitty Cords, DO    Family History Family History  Problem Relation Age of Onset   Prostate cancer Neg Hx    Colon cancer Neg Hx    Heart disease Neg Hx     Social History Social History   Tobacco Use   Smoking status: Never   Smokeless tobacco: Never  Vaping Use    Vaping status: Never Used  Substance Use Topics   Alcohol use: Yes    Alcohol/week: 1.0 standard drink of alcohol    Types: 1 Cans of beer per week    Comment: Rare alcohol consumption   Drug use: No     Allergies   Patient has no known allergies.   Review of Systems Review of Systems :negative unless otherwise stated in HPI.      Physical Exam Triage Vital Signs ED Triage Vitals  Encounter Vitals Group     BP 08/24/22 0851 129/81     Systolic BP Percentile --      Diastolic BP Percentile --      Pulse Rate 08/24/22 0851 68     Resp 08/24/22 0851 16     Temp 08/24/22 0851 97.9 F (36.6 C)     Temp Source 08/24/22 0851 Oral     SpO2 08/24/22 0851 99 %     Weight --      Height --      Head Circumference --      Peak Flow --      Pain Score 08/24/22 0850 3     Pain Loc --      Pain Education --      Exclude from  Growth Chart --    No data found.  Updated Vital Signs BP 129/81 (BP Location: Left Arm)   Pulse 68   Temp 97.9 F (36.6 C) (Oral)   Resp 16   SpO2 99%   Visual Acuity Right Eye Distance:   Left Eye Distance:   Bilateral Distance:    Right Eye Near:   Left Eye Near:    Bilateral Near:     Physical Exam  GEN: alert, well appearing male, in no acute distress  CV: regular rate, strong DP pulse  RESP: no increased work of breathing, clear to ascultation bilaterally MSK: no extremity edema, 1 cm 2nd toe with skin flap when manipulated bleeds, normal ROM of foot and toes, neurovascularly intact  SKIN: warm and dry; see MSK above      UC Treatments / Results  Labs (all labs ordered are listed, but only abnormal results are displayed) Labs Reviewed - No data to display  EKG   Radiology No results found.  Procedures Procedures (including critical care time)  Medications Ordered in UC Medications  Tdap (BOOSTRIX) injection 0.5 mL (has no administration in time range)    Initial Impression / Assessment and Plan / UC Course  I have  reviewed the triage vital signs and the nursing notes.  Pertinent labs & imaging results that were available during my care of the patient were reviewed by me and considered in my medical decision making (see chart for details).     Pt is a 27 y.o. male who presents after injuring 2ng toe on the right about 20 hours ago.  Discussed clean versus dirty wounds with patient.  Cleaned the wound here.  Unable to close it has been greater than 18 hours.  Wound to heal by secondary intention.  Pressure dressing applied and he is not to remove for the next 72 hours.  Tetanus updated prior to discharge.  Given Keflex 3 times daily for 7 days for prophylaxis.  OTC analgesics as needed for pain.  Tetanus updated prior to discharge.     Final Clinical Impressions(s) / UC Diagnoses   Final diagnoses:  Laceration of second toe of right foot, initial encounter     Discharge Instructions      Keep the pressure dressing on for the next 3 days.  Take antibiotics as prescribed.  You were given a tetanus vaccine.      ED Prescriptions     Medication Sig Dispense Auth. Provider   cephALEXin (KEFLEX) 500 MG capsule Take 1 capsule (500 mg total) by mouth 3 (three) times daily. 21 capsule Katha Cabal, DO      PDMP not reviewed this encounter.              Katha Cabal, DO 08/24/22 [phone]

## 2022-12-06 ENCOUNTER — Encounter: Payer: Self-pay | Admitting: Family Medicine

## 2022-12-08 NOTE — Telephone Encounter (Signed)
Left message for patient to schedule an appointment.
